# Patient Record
Sex: Male | Born: 1945 | Race: Black or African American | Hispanic: No | Marital: Married | Smoking: Never smoker
Health system: Southern US, Community
[De-identification: ages and names within clinical notes are randomized; demographics above are authoritative.]

## PROBLEM LIST (undated history)

## (undated) HISTORY — PX: OTHER SURGICAL HISTORY: SHX169

## (undated) HISTORY — PX: COLONOSCOPY: SHX174

---

## 2004-08-16 ENCOUNTER — Ambulatory Visit: Payer: Self-pay | Admitting: Family Medicine

## 2015-10-09 DIAGNOSIS — I110 Hypertensive heart disease with heart failure: Secondary | ICD-10-CM | POA: Insufficient documentation

## 2015-10-09 DIAGNOSIS — J449 Chronic obstructive pulmonary disease, unspecified: Secondary | ICD-10-CM

## 2015-10-09 DIAGNOSIS — N400 Enlarged prostate without lower urinary tract symptoms: Secondary | ICD-10-CM

## 2015-10-09 DIAGNOSIS — E785 Hyperlipidemia, unspecified: Secondary | ICD-10-CM

## 2015-10-09 HISTORY — DX: Chronic obstructive pulmonary disease, unspecified: J44.9

## 2015-10-09 HISTORY — DX: Hyperlipidemia, unspecified: E78.5

## 2015-10-09 HISTORY — DX: Benign prostatic hyperplasia without lower urinary tract symptoms: N40.0

## 2015-10-09 HISTORY — DX: Hypertensive heart disease with heart failure: I11.0

## 2016-02-19 DIAGNOSIS — G4733 Obstructive sleep apnea (adult) (pediatric): Secondary | ICD-10-CM

## 2016-02-19 HISTORY — DX: Obstructive sleep apnea (adult) (pediatric): G47.33

## 2016-03-14 DIAGNOSIS — F419 Anxiety disorder, unspecified: Secondary | ICD-10-CM

## 2016-03-14 HISTORY — DX: Anxiety disorder, unspecified: F41.9

## 2016-08-07 DIAGNOSIS — I2510 Atherosclerotic heart disease of native coronary artery without angina pectoris: Secondary | ICD-10-CM | POA: Insufficient documentation

## 2016-08-07 HISTORY — DX: Atherosclerotic heart disease of native coronary artery without angina pectoris: I25.10

## 2017-06-16 DIAGNOSIS — G8929 Other chronic pain: Secondary | ICD-10-CM

## 2017-06-16 HISTORY — DX: Other chronic pain: G89.29

## 2017-06-23 DIAGNOSIS — M1711 Unilateral primary osteoarthritis, right knee: Secondary | ICD-10-CM | POA: Insufficient documentation

## 2017-06-23 HISTORY — DX: Unilateral primary osteoarthritis, right knee: M17.11

## 2017-12-25 DIAGNOSIS — I214 Non-ST elevation (NSTEMI) myocardial infarction: Secondary | ICD-10-CM

## 2017-12-25 DIAGNOSIS — E6609 Other obesity due to excess calories: Secondary | ICD-10-CM

## 2017-12-25 HISTORY — DX: Non-ST elevation (NSTEMI) myocardial infarction: I21.4

## 2017-12-25 HISTORY — DX: Other obesity due to excess calories: E66.09

## 2018-01-19 DIAGNOSIS — Z955 Presence of coronary angioplasty implant and graft: Secondary | ICD-10-CM

## 2018-01-19 HISTORY — DX: Presence of coronary angioplasty implant and graft: Z95.5

## 2018-08-12 DIAGNOSIS — Z794 Long term (current) use of insulin: Secondary | ICD-10-CM | POA: Insufficient documentation

## 2018-08-12 HISTORY — DX: Long term (current) use of insulin: Z79.4

## 2018-11-16 DIAGNOSIS — J309 Allergic rhinitis, unspecified: Secondary | ICD-10-CM | POA: Insufficient documentation

## 2018-11-16 HISTORY — DX: Allergic rhinitis, unspecified: J30.9

## 2019-09-13 DIAGNOSIS — E1169 Type 2 diabetes mellitus with other specified complication: Secondary | ICD-10-CM

## 2019-09-13 DIAGNOSIS — G3184 Mild cognitive impairment, so stated: Secondary | ICD-10-CM

## 2019-09-13 DIAGNOSIS — Z789 Other specified health status: Secondary | ICD-10-CM

## 2019-09-13 HISTORY — DX: Mild cognitive impairment of uncertain or unknown etiology: G31.84

## 2019-09-13 HISTORY — DX: Other specified health status: Z78.9

## 2019-09-13 HISTORY — DX: Type 2 diabetes mellitus with other specified complication: E11.69

## 2019-11-11 DIAGNOSIS — R000 Tachycardia, unspecified: Secondary | ICD-10-CM | POA: Insufficient documentation

## 2019-11-11 HISTORY — DX: Tachycardia, unspecified: R00.0

## 2019-12-21 DIAGNOSIS — M47812 Spondylosis without myelopathy or radiculopathy, cervical region: Secondary | ICD-10-CM

## 2019-12-21 DIAGNOSIS — M542 Cervicalgia: Secondary | ICD-10-CM | POA: Insufficient documentation

## 2019-12-21 HISTORY — DX: Cervicalgia: M54.2

## 2019-12-21 HISTORY — DX: Spondylosis without myelopathy or radiculopathy, cervical region: M47.812

## 2019-12-30 DIAGNOSIS — M542 Cervicalgia: Secondary | ICD-10-CM

## 2019-12-30 DIAGNOSIS — M869 Osteomyelitis, unspecified: Secondary | ICD-10-CM

## 2019-12-30 DIAGNOSIS — M4642 Discitis, unspecified, cervical region: Secondary | ICD-10-CM

## 2019-12-31 DIAGNOSIS — M869 Osteomyelitis, unspecified: Secondary | ICD-10-CM | POA: Diagnosis not present

## 2019-12-31 DIAGNOSIS — M542 Cervicalgia: Secondary | ICD-10-CM | POA: Diagnosis not present

## 2019-12-31 DIAGNOSIS — M4642 Discitis, unspecified, cervical region: Secondary | ICD-10-CM | POA: Diagnosis not present

## 2020-01-01 DIAGNOSIS — M4642 Discitis, unspecified, cervical region: Secondary | ICD-10-CM | POA: Insufficient documentation

## 2020-01-01 DIAGNOSIS — M869 Osteomyelitis, unspecified: Secondary | ICD-10-CM | POA: Diagnosis not present

## 2020-01-01 DIAGNOSIS — M542 Cervicalgia: Secondary | ICD-10-CM | POA: Diagnosis not present

## 2020-01-01 DIAGNOSIS — M4622 Osteomyelitis of vertebra, cervical region: Secondary | ICD-10-CM

## 2020-01-01 HISTORY — DX: Osteomyelitis of vertebra, cervical region: M46.22

## 2020-01-01 HISTORY — DX: Discitis, unspecified, cervical region: M46.42

## 2020-01-02 DIAGNOSIS — M869 Osteomyelitis, unspecified: Secondary | ICD-10-CM | POA: Diagnosis not present

## 2020-01-02 DIAGNOSIS — M542 Cervicalgia: Secondary | ICD-10-CM | POA: Diagnosis not present

## 2020-01-02 DIAGNOSIS — M4642 Discitis, unspecified, cervical region: Secondary | ICD-10-CM | POA: Diagnosis not present

## 2020-01-03 DIAGNOSIS — M4642 Discitis, unspecified, cervical region: Secondary | ICD-10-CM | POA: Diagnosis not present

## 2020-01-03 DIAGNOSIS — M869 Osteomyelitis, unspecified: Secondary | ICD-10-CM | POA: Diagnosis not present

## 2020-01-03 DIAGNOSIS — M542 Cervicalgia: Secondary | ICD-10-CM | POA: Diagnosis not present

## 2020-01-04 DIAGNOSIS — M869 Osteomyelitis, unspecified: Secondary | ICD-10-CM | POA: Diagnosis not present

## 2020-01-04 DIAGNOSIS — M4642 Discitis, unspecified, cervical region: Secondary | ICD-10-CM | POA: Diagnosis not present

## 2020-01-04 DIAGNOSIS — E039 Hypothyroidism, unspecified: Secondary | ICD-10-CM | POA: Insufficient documentation

## 2020-01-04 DIAGNOSIS — M542 Cervicalgia: Secondary | ICD-10-CM | POA: Diagnosis not present

## 2020-01-04 DIAGNOSIS — I2510 Atherosclerotic heart disease of native coronary artery without angina pectoris: Secondary | ICD-10-CM | POA: Insufficient documentation

## 2020-01-04 DIAGNOSIS — F339 Major depressive disorder, recurrent, unspecified: Secondary | ICD-10-CM | POA: Insufficient documentation

## 2020-01-05 DIAGNOSIS — R1310 Dysphagia, unspecified: Secondary | ICD-10-CM | POA: Insufficient documentation

## 2020-01-05 DIAGNOSIS — R41841 Cognitive communication deficit: Secondary | ICD-10-CM | POA: Insufficient documentation

## 2020-01-05 DIAGNOSIS — Z7409 Other reduced mobility: Secondary | ICD-10-CM | POA: Insufficient documentation

## 2020-01-05 DIAGNOSIS — M6281 Muscle weakness (generalized): Secondary | ICD-10-CM | POA: Insufficient documentation

## 2020-01-05 DIAGNOSIS — R279 Unspecified lack of coordination: Secondary | ICD-10-CM | POA: Insufficient documentation

## 2020-01-19 ENCOUNTER — Ambulatory Visit (INDEPENDENT_AMBULATORY_CARE_PROVIDER_SITE_OTHER): Payer: Medicare Other | Admitting: Internal Medicine

## 2020-01-19 ENCOUNTER — Encounter: Payer: Self-pay | Admitting: *Deleted

## 2020-01-19 ENCOUNTER — Other Ambulatory Visit: Payer: Self-pay | Admitting: *Deleted

## 2020-01-19 ENCOUNTER — Other Ambulatory Visit: Payer: Self-pay

## 2020-01-19 ENCOUNTER — Encounter: Payer: Self-pay | Admitting: Internal Medicine

## 2020-01-19 DIAGNOSIS — E1169 Type 2 diabetes mellitus with other specified complication: Secondary | ICD-10-CM

## 2020-01-19 DIAGNOSIS — N400 Enlarged prostate without lower urinary tract symptoms: Secondary | ICD-10-CM

## 2020-01-19 DIAGNOSIS — I2510 Atherosclerotic heart disease of native coronary artery without angina pectoris: Secondary | ICD-10-CM | POA: Diagnosis not present

## 2020-01-19 DIAGNOSIS — I10 Essential (primary) hypertension: Secondary | ICD-10-CM

## 2020-01-19 DIAGNOSIS — M4642 Discitis, unspecified, cervical region: Secondary | ICD-10-CM

## 2020-01-19 DIAGNOSIS — E119 Type 2 diabetes mellitus without complications: Secondary | ICD-10-CM

## 2020-01-19 HISTORY — DX: Essential (primary) hypertension: I10

## 2020-01-19 HISTORY — DX: Type 2 diabetes mellitus without complications: E11.9

## 2020-01-19 NOTE — Progress Notes (Signed)
[facility]      Reason for Consult:discitis/ostoemyelitis of cervical region    Referring Physician: Dr. Lucianne Muss    Patient ID: Lance Shepherd, male    DOB: 1946/07/14, 74 y.o.   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  He had progressively worsening neck pain and MRI c/w above diagnosis.  He had blood culture done which did not grow anything and placed on empiric vancomycin and ceftriaxone and is currently in a SNF.  His neck pain is improved though now with more left shoulder muscular pain.  Is getting rehab at his facility.  MRI report reviewed  PMH: DM, COPD, HOH, HTN  Prior to Admission medications   Medication Sig Start Date End Date Taking? Authorizing Provider  acetaminophen (TYLENOL) 650 MG CR tablet Take by mouth.    [provider]  aspirin 81 MG EC tablet Take by mouth. 12/30/17   [provider]  BD VEO INSULIN SYRINGE U/F 31G X 15/64" 1 ML MISC USE 3 SYRINGE ONCE DAILY AS NEEDED 10/21/19   [provider]  cephALEXin (KEFLEX) 250 MG capsule Take 250 mg by mouth 3 (three) times daily. 11/11/19   [provider]  CONTOUR TEST test strip USE STRIP TO CHECK GLUCOSE THREE TIMES DAILY 10/17/19   [provider]  fluticasone (FLONASE) 50 MCG/ACT nasal spray 1 spray by Each Nare route daily as needed for rhinitis.    [provider]  fluticasone-salmeterol (ADVAIR HFA) 230-21 MCG/ACT inhaler Inhale into the lungs.    [provider]  HYDROcodone-acetaminophen (NORCO) 10-325 MG tablet Take 1 tablet by mouth every 4 (four) hours. 01/17/20   [provider]  insulin NPH-regular Human (70-30) 100 UNIT/ML injection Inject into the skin. 12/29/17   [provider]  INSULIN SYRINGE 1CC/29G (EXEL COMFORT POINT INSULIN SYR) 29G X 1/2" 1 ML MISC Use as  needed for 3 injections daily 10/14/19   [provider]  latanoprost (XALATAN) 0.005 % ophthalmic solution Place 1 drop into both eyes nightly. One drop in each eye daily. 02/13/17   [provider]  lidocaine (LIDODERM) 5 % APPLY ONE PATCH TOPICALLY TO CLEAN DRY SKIN. LEAVE ON FOR 12 HOURS THEN REMOVE. MUST WAIT AT LEAST 12 HOURS BEFORE APPLYING PATCH(ES) AGAIN. 12/26/19   [provider]  LINZESS 145 MCG CAPS capsule Take 145 mcg by mouth daily. 10/14/19   [provider]  lisinopril-hydrochlorothiazide (ZESTORETIC) 10-12.5 MG tablet Take by mouth. 08/21/16   [provider]  loratadine (CLARITIN) 10 MG tablet TAKE ONE TABLET DAILY AS NEEDED 04/10/17   [provider]  losartan (COZAAR) 25 MG tablet Take 25 mg by mouth daily. 12/20/19   [provider]  metFORMIN (GLUCOPHAGE-XR) 500 MG 24 hr tablet SMARTSIG:1 Tablet(s) By Mouth Every Evening 11/11/19   [provider]  methocarbamol (ROBAXIN) 750 MG tablet Take 750 mg by mouth 2 (two) times daily. 12/14/19   [provider]  metoprolol tartrate (LOPRESSOR) 50 MG tablet TAKE 1 2 (ONE HALF) TABLET BY MOUTH TWICE DAILY. CHECK BLOOD PRESSURE FIRST DO NOT TAKE IF BLOOD PRESSURE IS LESS THAN 100 11/11/19   [provider]  naproxen (NAPROSYN) 500 MG tablet Take 500 mg by mouth every 8 (eight) hours as needed. 12/14/19   [provider]  nitroGLYCERIN (NITROSTAT) 0.4 MG SL tablet Place under  the tongue. 04/27/18   [provider]  PERCOCET 10-325 MG tablet Take 1 tablet by mouth every 4 (four) hours. 01/18/20   [provider]  prasugrel (EFFIENT) 10 MG TABS tablet Take by mouth. 08/21/16   [provider]  tamsulosin (FLOMAX) 0.4 MG CAPS capsule Take 0.4 mg by mouth daily. 12/26/19   [provider]  tiotropium (SPIRIVA) 18 MCG inhalation capsule Place into inhaler and inhale.    [provider]  traMADol (ULTRAM) 50 MG tablet Take  50 mg by mouth every 8 (eight) hours as needed. 12/26/19   [provider]  VALIUM 2 MG tablet Take 2 mg by mouth 2 (two) times daily as needed. 12/18/19   [provider]    Allergies  Allergen Reactions  . Ticagrelor Shortness Of Breath  . Pioglitazone Diarrhea  . Statins Nausea Only and Rash    Social History   Tobacco Use  . Smoking status: Not on file  Substance Use Topics  . Alcohol use: Not on file  . Drug use: Not on file   [facility]: + cardiac disease  Review of Systems  Constitutional: negative for fevers and chills Gastrointestinal: negative for nausea and diarrhea Integument/breast: negative for rash All other systems reviewed and are negative    Constitutional: in no apparent distress There were no vitals filed for this visit. EYES: anicteric ENMT: + neck brace Cardiovascular: Cor RRR Respiratory: clear Musculoskeletal: + pedal edema noted Skin: no rash Neuro: non-focal; left shoulder with normal ROM, no swelling, no warmth  Labs: No results found for: WBC, HGB, HCT, MCV, PLT No results found for: CREATININE, BUN, NA, K, CL, CO2 No results found for: ALT, AST, GGT, ALKPHOS, BILITOT, INR   Assessment: Cervical discitis with phlegmon.  Negative cultures and plan for 6 weeks of IV ceftriaxone and vancomycin. Will get his labs and follow up in 4 weeks to see how he is doing and if 6 weeks is felt to be sufficient.   Plan: 1) continue antibiotics 2) copy of labs rtc 4 weeks

## 2020-02-28 ENCOUNTER — Encounter: Payer: Self-pay | Admitting: Internal Medicine

## 2020-02-28 ENCOUNTER — Other Ambulatory Visit: Payer: Self-pay

## 2020-02-28 ENCOUNTER — Ambulatory Visit (INDEPENDENT_AMBULATORY_CARE_PROVIDER_SITE_OTHER): Payer: Medicare Other | Admitting: Internal Medicine

## 2020-02-28 VITALS — BP 148/83 | HR 90 | Temp 97.9°F | Wt 229.0 lb

## 2020-02-28 DIAGNOSIS — Z5181 Encounter for therapeutic drug level monitoring: Secondary | ICD-10-CM

## 2020-02-28 DIAGNOSIS — E1169 Type 2 diabetes mellitus with other specified complication: Secondary | ICD-10-CM | POA: Diagnosis not present

## 2020-02-28 DIAGNOSIS — I2510 Atherosclerotic heart disease of native coronary artery without angina pectoris: Secondary | ICD-10-CM | POA: Diagnosis not present

## 2020-02-28 DIAGNOSIS — M4642 Discitis, unspecified, cervical region: Secondary | ICD-10-CM | POA: Diagnosis present

## 2020-02-29 LAB — BASIC METABOLIC PANEL
BUN: 20 mg/dL (ref 7–25)
CO2: 30 mmol/L (ref 20–32)
Calcium: 10 mg/dL (ref 8.6–10.3)
Chloride: 97 mmol/L — ABNORMAL LOW (ref 98–110)
Creat: 1.18 mg/dL (ref 0.70–1.18)
Glucose, Bld: 266 mg/dL — ABNORMAL HIGH (ref 65–99)
Potassium: 4.9 mmol/L (ref 3.5–5.3)
Sodium: 135 mmol/L (ref 135–146)

## 2020-02-29 LAB — SEDIMENTATION RATE: Sed Rate: 11 mm/h (ref 0–20)

## 2020-02-29 LAB — C-REACTIVE PROTEIN: CRP: 7.4 mg/L (ref <8.0)

## 2020-03-01 ENCOUNTER — Encounter: Payer: Self-pay | Admitting: Internal Medicine

## 2020-03-01 DIAGNOSIS — Z5181 Encounter for therapeutic drug level monitoring: Secondary | ICD-10-CM

## 2020-03-01 HISTORY — DX: Encounter for therapeutic drug level monitoring: Z51.81

## 2020-03-01 NOTE — Assessment & Plan Note (Signed)
He is doing well clinically with no significant residual symptoms at this time off of antibiotics over 1 week.  CRP and ESR at this visit are both wnl.   No concerns and he can remain off of antibiotics and knows to call with any new concerns.   rtc prn

## 2020-03-01 NOTE — Progress Notes (Signed)
   Subjective:    Patient ID: Lance Shepherd, male    DOB: 06/29/1946, 74 y.o.   MRN: [id number]  HPI Here for follow up of discitis with phlegmon of cervical region from [facility].  This was culture negative.  He was on vancomycin and ceftriaxone and completed 6 weeks in a NH/rehab.  He is now home and picc line has been removed.  He is having very minimal pain and feels much better.  Has been off of antibiotics over 1 week.  Some minimal tingling in his left foot but no numbness or tingling of the upper extremities.  No rash.    Review of Systems  Constitutional: Negative for fatigue and fever.  Musculoskeletal: Negative for arthralgias, back pain and myalgias.  Skin: Negative for rash.       Objective:   Physical Exam Constitutional:      Appearance: Normal appearance.  Eyes:     General: No scleral icterus. Pulmonary:     Effort: Pulmonary effort is normal.  Neurological:     General: No focal deficit present.     Mental Status: He is alert.  Psychiatric:        Mood and Affect: Mood normal.   SH: no tobacco        Assessment & Plan:

## 2020-03-01 NOTE — Assessment & Plan Note (Signed)
Blood sugar checked and elvated.   He will need continued efforts at good DM management.

## 2020-03-01 NOTE — Assessment & Plan Note (Signed)
Bmp checked and wnl.

## 2020-04-10 DIAGNOSIS — K5909 Other constipation: Secondary | ICD-10-CM

## 2020-04-10 DIAGNOSIS — F919 Conduct disorder, unspecified: Secondary | ICD-10-CM

## 2020-04-10 HISTORY — DX: Other constipation: K59.09

## 2020-04-10 HISTORY — DX: Conduct disorder, unspecified: F91.9

## 2020-08-16 ENCOUNTER — Other Ambulatory Visit: Payer: Self-pay

## 2020-08-16 ENCOUNTER — Telehealth (HOSPITAL_COMMUNITY): Payer: Self-pay | Admitting: *Deleted

## 2020-08-16 ENCOUNTER — Encounter: Payer: Self-pay | Admitting: Cardiology

## 2020-08-16 ENCOUNTER — Ambulatory Visit (INDEPENDENT_AMBULATORY_CARE_PROVIDER_SITE_OTHER): Payer: Medicare Other | Admitting: Cardiology

## 2020-08-16 VITALS — BP 130/70 | HR 97 | Ht 70.0 in | Wt 248.0 lb

## 2020-08-16 DIAGNOSIS — E669 Obesity, unspecified: Secondary | ICD-10-CM

## 2020-08-16 DIAGNOSIS — E1169 Type 2 diabetes mellitus with other specified complication: Secondary | ICD-10-CM | POA: Diagnosis not present

## 2020-08-16 DIAGNOSIS — I10 Essential (primary) hypertension: Secondary | ICD-10-CM

## 2020-08-16 DIAGNOSIS — R079 Chest pain, unspecified: Secondary | ICD-10-CM

## 2020-08-16 DIAGNOSIS — E782 Mixed hyperlipidemia: Secondary | ICD-10-CM

## 2020-08-16 NOTE — Progress Notes (Signed)
Cardiology Consultation:    Date:  08/16/2020   ID:  Lance Shepherd, DOB 08/21/1946, MRN [id number]  PCP:  [name], MD  Cardiologist:  Gypsy Balsam, MD   Referring MD: [name], MD   Chief Complaint  Patient presents with  . Follow-up  I would like to be establish as a patient  History of Present Illness:    Lance Shepherd is a 74 y.o. male who is being seen today for the evaluation of coronary artery disease at the request of [name], MD.  Complex past medical history he does have poorly controlled diabetes with latest hemoglobin A1c of 11.9, dyslipidemia with intolerance to statin, COPD, remote history of smoking, coronary artery disease with PTCA and stenting to the right coronary artery and circumflex artery November 2017, another PTCA and stenting of the obtuse marginal branch in 2019.  He was referred to [place] to be establish as a patient in our practice.  Overall he seems to be doing well but described to have weakness fatigue and tiredness he said he simply getting all.  Denies have any chest pain tightness squeezing pressure burning chest but he does have diabetes which is poorly controlled for long period of time.  Exertional shortness of breath is there, no swelling of lower extremities, he does have sleep apnea but does not use CPAP mask.  Does have dyslipidemia which is not controlled because of intolerance to statin.  He does not smoke anymore quit smoking years ago.  Does have family history of coronary artery disease but not premature.  Past Medical History:  Diagnosis Date  . Acute neck pain 12/21/2019  . Allergic rhinitis 11/16/2018  . Behavior disturbance 04/10/2020  . Benign prostatic hyperplasia without lower urinary tract symptoms 10/09/2015  . Cervical discitis 01/01/2020  . Chronic obstructive pulmonary disease (HCC) 10/09/2015  . Chronic pain of left knee 06/16/2017  . Constipation, chronic 04/10/2020  . Coronary arteriosclerosis in native artery  08/07/2016  . Diabetes mellitus (HCC) 01/19/2020  . 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 HTN (hypertension) 01/19/2020  . Hyperlipidemia 10/09/2015  . 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 Long-term insulin use (HCC) 08/12/2018  . MCI (mild cognitive impairment) 09/13/2019  . Medication monitoring encounter 03/01/2020  . Mild anxiety 03/14/2016  . NSTEMI, initial episode of care [facility]) 12/25/2017   Formatting of this note might be different from the original. Added automatically from request for surgery [phone]  . Obesity due to excess calories 12/25/2017  . Obstructive sleep apnea syndrome 02/19/2016  . Osteomyelitis of cervical spine (HCC) 01/01/2020   Formatting of this note might be different from the original. C5-6  . Primary osteoarthritis of right knee 06/23/2017  . Sinus tachycardia by electrocardiogram 11/11/2019  . Spondylosis of cervical region without myelopathy or radiculopathy 12/21/2019  . Statin intolerance 09/13/2019  . Status post coronary artery stent placement 01/19/2018    Past Surgical History:  Procedure Laterality Date  . cardiacstent placement    . COLONOSCOPY    . hernia repaired    . stent replacement      Current Medications: Current Meds  Medication Sig  . aspirin 81 MG EC tablet Take by mouth.  . cetirizine (ZYRTEC) 10 MG tablet Take 10 mg by mouth daily.  . fluticasone (FLONASE) 50 MCG/ACT nasal spray 1 spray by Each Nare route daily as needed for rhinitis.  Marland Kitchen insulin NPH-regular Human (70-30) 100 UNIT/ML injection  Inject into the skin.  Marland Kitchen LINZESS 145 MCG CAPS capsule Take 145 mcg by mouth daily.  Marland Kitchen losartan (COZAAR) 25 MG tablet Take 25 mg by mouth daily.  . metoprolol tartrate (LOPRESSOR) 50 MG tablet TAKE 1 2 (ONE HALF) TABLET BY MOUTH TWICE DAILY. CHECK BLOOD PRESSURE FIRST DO NOT TAKE IF BLOOD PRESSURE IS LESS THAN 100  . nitroGLYCERIN (NITROSTAT) 0.4 MG SL tablet Place under the tongue.  . tamsulosin (FLOMAX) 0.4 MG  CAPS capsule Take 0.4 mg by mouth daily.     Allergies:   Ticagrelor, Pioglitazone, and Statins   Social History   Socioeconomic History  . Marital status: Married    Spouse name: Not on file  . Number of children: Not on file  . Years of education: Not on file  . Highest education level: Not on file  Occupational History  . Not on file  Tobacco Use  . Smoking status: Never Smoker  . Smokeless tobacco: Never Used  Substance and Sexual Activity  . Alcohol use: Never  . Drug use: Never  . Sexual activity: Not Currently  Other Topics Concern  . Not on file  Social History Narrative  . Not on file   Social Determinants of Health   Financial Resource Strain: Not on file  Food Insecurity: Not on file  Transportation Needs: Not on file  Physical Activity: Not on file  Stress: Not on file  Social Connections: Not on file     Family History: The patient's family history includes COPD in his mother; Dementia in his mother; Lung cancer in his father. ROS:   Please see the history of present illness.    All 14 point review of systems negative except as described per history of present illness.  EKGs/Labs/Other Studies Reviewed:    The following studies were reviewed today:   EKG:  EKG is  ordered today.  The ekg ordered today demonstrates normal sinus rhythm normal P interval right bundle branch block  Recent Labs: 02/28/2020: BUN 20; Creat 1.18; Potassium 4.9; Sodium 135  Recent Lipid Panel No results found for: CHOL, TRIG, HDL, CHOLHDL, VLDL, LDLCALC, LDLDIRECT  Physical Exam:    VS:  BP 130/70 (BP Location: Left Arm, Patient Position: Sitting)   Pulse 97   Ht 5\' 10"  (1.778 m)   Wt 248 lb (112.5 kg)   SpO2 97%   BMI 35.58 kg/m     Wt Readings from Last 3 Encounters:  08/16/20 248 lb (112.5 kg)  02/28/20 229 lb (103.9 kg)  01/19/20 244 lb (110.7 kg)     GEN:  Well nourished, well developed in no acute distress HEENT: Normal NECK: No JVD; No carotid  bruits LYMPHATICS: No lymphadenopathy CARDIAC: RRR, no murmurs, no rubs, no gallops RESPIRATORY:  Clear to auscultation without rales, wheezing or rhonchi  ABDOMEN: Soft, non-tender, non-distended MUSCULOSKELETAL:  No edema; No deformity  SKIN: Warm and dry NEUROLOGIC:  Alert and oriented x 3 PSYCHIATRIC:  Normal affect   ASSESSMENT:    1. Primary hypertension   2. Diabetes mellitus type 2 in obese (HCC)   3. Mixed hyperlipidemia    PLAN:    In order of problems listed above:   1. Coronary artery disease: He did have stenting to RCA and circumflex artery November 2017 and then stenting to obtuse marginal branch in 2019.  He seems to have no typical symptoms, however, because of his diabetes which is poorly controlled I still think it would be reasonable to do stress  test on him make sure he does not have any inducible ischemia.  He is already on antiplatelet therapy which I will continue but he is not on statin.  He is intolerant to it.  He is already on ACE inhibitor.  Also on beta-blocker. 2. Diabetes mellitus poorly controlled.  He just switched to a new primary care physician with premature will take care of this problem better than previously.  Obviously the goal will be to improve his diabetes control with hopefully target, globin A1c around 7. 3. Mixed dyslipidemia: He is intolerant to statin with multiple muscle aches.  I will refer him to our lipid clinic for consideration of different medication I like PCSK9 agent. 4. I will schedule him to have an echocardiogram to assess left ventricle ejection fraction, also will be looking for pulmonary hypertension, he will be scheduled to have Lexiscan to rule out significant ischemia.  I will also try to modify his risk factors the best we can to prevent him to have a problem in the future.   Medication Adjustments/Labs and Tests Ordered: Current medicines are reviewed at length with the patient today.  Concerns regarding medicines are  outlined above.  No orders of the defined types were placed in this encounter.  No orders of the defined types were placed in this encounter.   Signed, Georgeanna Lea, MD, [facility]. 08/16/2020 9:21 AM    [facility]

## 2020-08-16 NOTE — Patient Instructions (Addendum)
Medication Instructions:  Your physician recommends that you continue on your current medications as directed. Please refer to the Current Medication list given to you today.  *If you need a refill on your cardiac medications before your next appointment, please call your pharmacy*   Lab Work: None. If you have labs (blood work) drawn today and your tests are completely normal, you will receive your results only by: . MyChart Message (if you have MyChart) OR . A paper copy in the mail If you have any lab test that is abnormal or we need to change your treatment, we will call you to review the results.   Testing/Procedures: Your physician has requested that you have an echocardiogram. Echocardiography is a painless test that uses sound waves to create images of your heart. It provides your doctor with information about the size and shape of your heart and how well your heart's chambers and valves are working. This procedure takes approximately one hour. There are no restrictions for this procedure.    [facility] [address] [place],  [address] Phone:  [phone]    Please arrive 15 minutes prior to your appointment time for registration and insurance purposes.  The test will take approximately 3 to 4 hours to complete; you may bring reading material.  If someone comes with you to your appointment, they will need to remain in the main lobby due to limited space in the testing area. **If you are pregnant or breastfeeding, please notify the nuclear lab prior to your appointment**  How to prepare for your Myocardial Perfusion Test: . Do not eat or drink 3 hours prior to your test, except you may have water. . Do not consume products containing caffeine (regular or decaffeinated) 12 hours prior to your test. (ex: coffee, chocolate, sodas, tea). . Do bring a list of your current medications with you.  If not listed below, you may take your medications  as normal.  . Do wear comfortable clothes (no dresses or overalls) and walking shoes, tennis shoes preferred (No heels or open toe shoes are allowed). . Do NOT wear cologne, perfume, aftershave, or lotions (deodorant is allowed). . If these instructions are not followed, your test will have to be rescheduled.  Please report to [address] for your test.  If you have questions or concerns about your appointment, you can call the [facility] at [phone].  If you cannot keep your appointment, please provide 24 hours notification to the Nuclear Lab, to avoid a possible $50 charge to your account.     Follow-Up: At [facility], you and your health needs are our priority.  As part of our continuing mission to provide you with exceptional heart care, we have created designated Provider Care Teams.  These Care Teams include your primary Cardiologist (physician) and Advanced Practice Providers (APPs -  Physician Assistants and Nurse Practitioners) who all work together to provide you with the care you need, when you need it.  We recommend signing up for the patient portal called "MyChart".  Sign up information is provided on this After Visit Summary.  MyChart is used to connect with patients for Virtual Visits (Telemedicine).  Patients are able to view lab/test results, encounter notes, upcoming appointments, etc.  Non-urgent messages can be sent to your provider as well.   To learn more about what you can do with MyChart, go to [email or web address].    Your next appointment:     6 week(s)  The format for your next appointment:   In Person  Provider:   Robert Krasowski, MD   Other Instructions   Echocardiogram An echocardiogram is a procedure that uses painless sound waves (ultrasound) to produce an image of the heart. Images from an echocardiogram can provide important information about:  Signs of coronary artery disease (CAD).  Aneurysm  detection. An aneurysm is a weak or damaged part of an artery wall that bulges out from the normal force of blood pumping through the body.  Heart size and shape. Changes in the size or shape of the heart can be associated with certain conditions, including heart failure, aneurysm, and CAD.  Heart muscle function.  Heart valve function.  Signs of a past heart attack.  Fluid buildup around the heart.  Thickening of the heart muscle.  A tumor or infectious growth around the heart valves. Tell a health care provider about:  Any allergies you have.  All medicines you are taking, including vitamins, herbs, eye drops, creams, and over-the-counter medicines.  Any blood disorders you have.  Any surgeries you have had.  Any medical conditions you have.  Whether you are pregnant or may be pregnant. What are the risks? Generally, this is a safe procedure. However, problems may occur, including:  Allergic reaction to dye (contrast) that may be used during the procedure. What happens before the procedure? No specific preparation is needed. You may eat and drink normally. What happens during the procedure?   An IV tube may be inserted into one of your veins.  You may receive contrast through this tube. A contrast is an injection that improves the quality of the pictures from your heart.  A gel will be applied to your chest.  A wand-like tool (transducer) will be moved over your chest. The gel will help to transmit the sound waves from the transducer.  The sound waves will harmlessly bounce off of your heart to allow the heart images to be captured in real-time motion. The images will be recorded on a computer. The procedure may vary among health care providers and hospitals. What happens after the procedure?  You may return to your normal, everyday life, including diet, activities, and medicines, unless your health care provider tells you not to do that. Summary  An  echocardiogram is a procedure that uses painless sound waves (ultrasound) to produce an image of the heart.  Images from an echocardiogram can provide important information about the size and shape of your heart, heart muscle function, heart valve function, and fluid buildup around your heart.  You do not need to do anything to prepare before this procedure. You may eat and drink normally.  After the echocardiogram is completed, you may return to your normal, everyday life, unless your health care provider tells you not to do that. This information is not intended to replace advice given to you by your health care provider. Make sure you discuss any questions you have with your health care provider. Document Revised: 12/16/2018 Document Reviewed: 09/27/2016 Elsevier Patient Education  2020 Elsevier Inc.   Cardiac Nuclear Scan A cardiac nuclear scan is a test that measures blood flow to the heart when a person is resting and when he or she is exercising. The test looks for problems such as:  Not enough blood reaching a portion of the heart.  The heart muscle not working normally. You may need this test if:  You have heart disease.  You have had abnormal   results.  You have had heart surgery or a balloon procedure to open up blocked arteries (angioplasty).  You have chest pain.  You have shortness of breath. In this test, a radioactive dye (tracer) is injected into your bloodstream. After the tracer has traveled to your heart, an imaging device is used to measure how much of the tracer is absorbed by or distributed to various areas of your heart. This procedure is usually done at a hospital and takes 2-4 hours. Tell a health care provider about:  Any allergies you have.  All medicines you are taking, including vitamins, herbs, eye drops, creams, and over-the-counter medicines.  Any problems you or family members have had with anesthetic medicines.  Any  blood disorders you have.  Any surgeries you have had.  Any medical conditions you have.  Whether you are pregnant or may be pregnant. What are the risks? Generally, this is a safe procedure. However, problems may occur, including:  Serious chest pain and heart attack. This is only a risk if the stress portion of the test is done.  Rapid heartbeat.  Sensation of warmth in your chest. This usually passes quickly.  Allergic reaction to the tracer. What happens before the procedure?  Ask your health care provider about changing or stopping your regular medicines. This is especially important if you are taking diabetes medicines or blood thinners.  Follow instructions from your health care provider about eating or drinking restrictions.  Remove your jewelry on the day of the procedure. What happens during the procedure?  An IV will be inserted into one of your veins.  Your health care provider will inject a small amount of radioactive tracer through the IV.  You will wait for 20-40 minutes while the tracer travels through your bloodstream.  Your heart activity will be monitored with an electrocardiogram (ECG).  You will lie down on an exam table.  Images of your heart will be taken for about 15-20 minutes.  You may also have a stress test. For this test, one of the following may be done: ? You will exercise on a treadmill or stationary bike. While you exercise, your heart's activity will be monitored with an ECG, and your blood pressure will be checked. ? You will be given medicines that will increase blood flow to parts of your heart. This is done if you are unable to exercise.  When blood flow to your heart has peaked, a tracer will again be injected through the IV.  After 20-40 minutes, you will get back on the exam table and have more images taken of your heart.  Depending on the type of tracer used, scans may need to be repeated 3-4 hours later.  Your IV line will be  removed when the procedure is over. The procedure may vary among health care providers and hospitals. What happens after the procedure?  Unless your health care provider tells you otherwise, you may return to your normal schedule, including diet, activities, and medicines.  Unless your health care provider tells you otherwise, you may increase your fluid intake. This will help to flush the contrast dye from your body. Drink enough fluid to keep your urine pale yellow.  Ask your health care provider, or the department that is doing the test: ? When will my results be ready? ? How will I get my results? Summary  A cardiac nuclear scan measures the blood flow to the heart when a person is resting and when he or she is  exercising.  Tell your health care provider if you are pregnant.  Before the procedure, ask your health care provider about changing or stopping your regular medicines. This is especially important if you are taking diabetes medicines or blood thinners.  After the procedure, unless your health care provider tells you otherwise, increase your fluid intake. This will help flush the contrast dye from your body.  After the procedure, unless your health care provider tells you otherwise, you may return to your normal schedule, including diet, activities, and medicines. This information is not intended to replace advice given to you by your health care provider. Make sure you discuss any questions you have with your health care provider. Document Revised: 02/08/2018 Document Reviewed: 02/08/2018 Elsevier Patient Education  [employer].

## 2020-08-16 NOTE — Telephone Encounter (Signed)
Patient given detailed instructions per Myocardial Perfusion Study Information Sheet for the test on 08/23/20 at 11:00. Patient notified to arrive 15 minutes early and that it is imperative to arrive on time for appointment to keep from having the test rescheduled.  If you need to cancel or reschedule your appointment, please call the office within 24 hours of your appointment. . Patient verbalized understanding.Daneil Dolin

## 2020-08-19 NOTE — Progress Notes (Signed)
Patient ID: Lance Shepherd                 DOB: 09-23-1945                    MRN: [id number]     HPI: Lance Shepherd is a 74 y.o. male patient referred to lipid clinic by Dr. Bing Matter. PMH is significant for CAD with DES to RCA and Cx in 07/2016 in addition to hx of NSTEMI with DES to obtuse marginal branch in 2019, HLD, HTN, remote hx of smoking, COPD, and poorly controlled T2DM followed by PCP.  Patient arrives today for initial visit. Of note, pt is hard of hearing. He is not currently taking any cholesterol lowering medications. He reports that he tried multiple statins in the past and states rosuvastatin sounds familiar, but he cannot remember any other statins he has tried or how many he has tried. He reports having extreme fatigue with the statins but denies myalgias. He does not adhere to cardiac healthy diet and eats fried foods often. He does not exercise regularly due to peripheral neuropathy. He reports that his DM is followed by his PCP but reports issues with medication affordability (has Medicare part D).  Current Medications: none Intolerances: rosuvastatin (extreme fatigue)  Risk Factors: ASCVD, HLD, HTN, poorly controlled T2DM, family history, former smoker LDL goal: <55 mg/dL  Diet: Eats out at restaurants often, enjoys [employer] and fried foods  Exercise: None   Family History: CAD (not premature), COPD and dementia in mother, lung cancer in father  Social History: former smoker  Labs: 09/13/19: TC 195, TG 156, HDL 35, LDL 138 03/13/20: A1c 10.4% 40/98/11: TC 183, HDL 35, TG 166, LDL 115, A1c 11.9%  Past Medical History:  Diagnosis Date  . Acute neck pain 12/21/2019  . Allergic rhinitis 11/16/2018  . Behavior disturbance 04/10/2020  . Benign prostatic hyperplasia without lower urinary tract symptoms 10/09/2015  . Cervical discitis 01/01/2020  . Chronic obstructive pulmonary disease (HCC) 10/09/2015  . Chronic pain of left knee 06/16/2017  . Constipation, chronic 04/10/2020  .  Coronary arteriosclerosis in native artery 08/07/2016  . Diabetes mellitus (HCC) 01/19/2020  . 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 HTN (hypertension) 01/19/2020  . Hyperlipidemia 10/09/2015  . 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 Long-term insulin use (HCC) 08/12/2018  . MCI (mild cognitive impairment) 09/13/2019  . Medication monitoring encounter 03/01/2020  . Mild anxiety 03/14/2016  . NSTEMI, initial episode of care [facility]) 12/25/2017   Formatting of this note might be different from the original. Added automatically from request for surgery [phone]  . Obesity due to excess calories 12/25/2017  . Obstructive sleep apnea syndrome 02/19/2016  . Osteomyelitis of cervical spine (HCC) 01/01/2020   Formatting of this note might be different from the original. C5-6  . Primary osteoarthritis of right knee 06/23/2017  . Sinus tachycardia by electrocardiogram 11/11/2019  . Spondylosis of cervical region without myelopathy or radiculopathy 12/21/2019  . Statin intolerance 09/13/2019  . Status post coronary artery stent placement 01/19/2018    Current Outpatient Medications on File Prior to Visit  Medication Sig Dispense Refill  . aspirin 81 MG EC tablet Take by mouth.    . cetirizine (ZYRTEC) 10 MG tablet Take 10 mg by mouth daily.    . fluticasone (FLONASE) 50 MCG/ACT nasal spray 1 spray by Each Nare route daily as needed for rhinitis.    . fluticasone-salmeterol (ADVAIR  HFA) 230-21 MCG/ACT inhaler Inhale into the lungs.    . insulin NPH-regular Human (70-30) 100 UNIT/ML injection Inject into the skin.    Marland Kitchen LINZESS 145 MCG CAPS capsule Take 145 mcg by mouth daily.    Marland Kitchen losartan (COZAAR) 25 MG tablet Take 25 mg by mouth daily.    . metoprolol tartrate (LOPRESSOR) 50 MG tablet TAKE 1 2 (ONE HALF) TABLET BY MOUTH TWICE DAILY. CHECK BLOOD PRESSURE FIRST DO NOT TAKE IF BLOOD PRESSURE IS LESS THAN 100    . nitroGLYCERIN (NITROSTAT) 0.4 MG SL tablet Place under the tongue.     . tamsulosin (FLOMAX) 0.4 MG CAPS capsule Take 0.4 mg by mouth daily.     No current facility-administered medications on file prior to visit.    Allergies  Allergen Reactions  . Ticagrelor Shortness Of Breath  . Pioglitazone Diarrhea  . Statins Nausea Only and Rash    Assessment/Plan:  1. Hyperlipidemia - LDL is elevated and above goal of < 55 mg/dL. Start pravastatin 20 mg daily. Insurance will require statin re-challenge of a second statin at lowest starting dose before PCSK9 inhibitor will be approved.  Will call pt in 3 weeks to assess tolerability. Consider starting Praluent 75 mg Q2 weeks if LDL remains above goal and/or patient does not tolerate medication. He is aware of $47 copay and is able to afford this. Educated patient on importance of cardiac healthy diet and maintaining a regular exercise regimen. He will avoid eating fried foods and try to take walks as he is able. Can recheck lipid panel and LFTs at next office visit with Dr. Bing Matter on 09/27/20 if tolerating medication well.   Tama Headings, PharmD, BCPS PGY2 Cardiology Pharmacy Resident

## 2020-08-20 ENCOUNTER — Other Ambulatory Visit: Payer: Self-pay

## 2020-08-20 ENCOUNTER — Ambulatory Visit (INDEPENDENT_AMBULATORY_CARE_PROVIDER_SITE_OTHER): Payer: Medicare Other | Admitting: Pharmacist

## 2020-08-20 DIAGNOSIS — E782 Mixed hyperlipidemia: Secondary | ICD-10-CM

## 2020-08-20 DIAGNOSIS — I2510 Atherosclerotic heart disease of native coronary artery without angina pectoris: Secondary | ICD-10-CM

## 2020-08-20 DIAGNOSIS — G720 Drug-induced myopathy: Secondary | ICD-10-CM

## 2020-08-20 DIAGNOSIS — T466X5A Adverse effect of antihyperlipidemic and antiarteriosclerotic drugs, initial encounter: Secondary | ICD-10-CM | POA: Diagnosis not present

## 2020-08-20 HISTORY — DX: Drug-induced myopathy: G72.0

## 2020-08-20 MED ORDER — PRAVASTATIN SODIUM 20 MG PO TABS
20.0000 mg | ORAL_TABLET | Freq: Every evening | ORAL | 3 refills | Status: DC
Start: 2020-08-20 — End: 2021-08-23

## 2020-08-20 NOTE — Patient Instructions (Addendum)
It was nice to meet you today   Your LDL is 115 and your goal is < 55  START taking pravastatin 20 mg daily   We will plan to recheck your cholesterol when you see Dr. Bing Matter on 09/27/20  Please call us at [phone] if you have any questions

## 2020-09-10 ENCOUNTER — Telehealth (HOSPITAL_COMMUNITY): Payer: Self-pay | Admitting: *Deleted

## 2020-09-10 NOTE — Telephone Encounter (Signed)
Patient given detailed instructions per Myocardial Perfusion Study Information Sheet for the test on 09/12/19. Patient notified to arrive 15 minutes early and that it is imperative to arrive on time for appointment to keep from having the test rescheduled.  If you need to cancel or reschedule your appointment, please call the office within 24 hours of your appointment. . Patient verbalized understanding. Robie Mcniel Jacqueline     

## 2020-09-11 ENCOUNTER — Ambulatory Visit (INDEPENDENT_AMBULATORY_CARE_PROVIDER_SITE_OTHER): Payer: Medicare Other

## 2020-09-11 ENCOUNTER — Telehealth: Payer: Self-pay | Admitting: Pharmacist

## 2020-09-11 ENCOUNTER — Other Ambulatory Visit: Payer: Self-pay

## 2020-09-11 DIAGNOSIS — I10 Essential (primary) hypertension: Secondary | ICD-10-CM | POA: Diagnosis not present

## 2020-09-11 DIAGNOSIS — R079 Chest pain, unspecified: Secondary | ICD-10-CM

## 2020-09-11 DIAGNOSIS — E669 Obesity, unspecified: Secondary | ICD-10-CM

## 2020-09-11 DIAGNOSIS — E1169 Type 2 diabetes mellitus with other specified complication: Secondary | ICD-10-CM

## 2020-09-11 DIAGNOSIS — E782 Mixed hyperlipidemia: Secondary | ICD-10-CM

## 2020-09-11 MED ORDER — REGADENOSON 0.4 MG/5ML IV SOLN
0.4000 mg | Freq: Once | INTRAVENOUS | Status: AC
  Administered 2020-09-11: 0.4 mg via INTRAVENOUS

## 2020-09-11 MED ORDER — TECHNETIUM TC 99M TETROFOSMIN IV KIT
31.9000 | PACK | Freq: Once | INTRAVENOUS | Status: AC | PRN
  Administered 2020-09-11: 31.9 via INTRAVENOUS

## 2020-09-11 MED ORDER — TECHNETIUM TC 99M TETROFOSMIN IV KIT
10.6000 | PACK | Freq: Once | INTRAVENOUS | Status: AC | PRN
  Administered 2020-09-11: 10.6 via INTRAVENOUS

## 2020-09-11 NOTE — Telephone Encounter (Signed)
Called pt to follow up with pravastatin tolerability. Pt reports tolerating well. Does not wish to increase his dose at this time. Pt has follow up with Dr Bing Matter in a few weeks. Will plan to have lipid panel rechecked at that time. Pending results, will see if pt is agreeable to increasing pravastatin dose at that time. Once he is taking max tolerated statin therapy, his insurance will cover PCSK9i if his LDL remains above goal.

## 2020-09-12 LAB — MYOCARDIAL PERFUSION IMAGING
LV dias vol: 72 mL (ref 62–150)
LV sys vol: 29 mL
Peak HR: 98 {beats}/min
Rest HR: 72 {beats}/min
SDS: 0
SRS: 3
SSS: 3
TID: 1.05

## 2020-09-13 ENCOUNTER — Telehealth: Payer: Self-pay

## 2020-09-13 NOTE — Telephone Encounter (Signed)
Left a message to return my call.

## 2020-09-13 NOTE — Telephone Encounter (Signed)
-----   Message from Georgeanna Lea, MD sent at 09/13/2020  4:18 PM EST ----- Stress test showed no evidence of ischemia

## 2020-09-13 NOTE — Telephone Encounter (Signed)
Patient returning a call from our office 

## 2020-09-14 ENCOUNTER — Telehealth: Payer: Self-pay

## 2020-09-14 NOTE — Telephone Encounter (Signed)
Patient notified of test results per Dr. Bing Matter. He verbalized understanding.

## 2020-09-14 NOTE — Telephone Encounter (Signed)
Patient returning a call from our office 

## 2020-09-19 ENCOUNTER — Other Ambulatory Visit: Payer: Self-pay

## 2020-09-19 ENCOUNTER — Ambulatory Visit (INDEPENDENT_AMBULATORY_CARE_PROVIDER_SITE_OTHER): Payer: Medicare Other

## 2020-09-19 DIAGNOSIS — E1169 Type 2 diabetes mellitus with other specified complication: Secondary | ICD-10-CM

## 2020-09-19 DIAGNOSIS — E669 Obesity, unspecified: Secondary | ICD-10-CM

## 2020-09-19 DIAGNOSIS — I10 Essential (primary) hypertension: Secondary | ICD-10-CM | POA: Diagnosis not present

## 2020-09-19 DIAGNOSIS — R079 Chest pain, unspecified: Secondary | ICD-10-CM | POA: Diagnosis not present

## 2020-09-19 DIAGNOSIS — E782 Mixed hyperlipidemia: Secondary | ICD-10-CM | POA: Diagnosis not present

## 2020-09-19 LAB — ECHOCARDIOGRAM COMPLETE
Area-P 1/2: 3.65 cm2
S' Lateral: 3.6 cm

## 2020-09-19 NOTE — Progress Notes (Unsigned)
Complete echocardiogram performed.  Jimmy Zianna Dercole RDCS, RVT  

## 2020-09-20 ENCOUNTER — Telehealth: Payer: Self-pay | Admitting: Cardiology

## 2020-09-20 NOTE — Telephone Encounter (Signed)
Patient returning call for echo results. 

## 2020-09-20 NOTE — Telephone Encounter (Signed)
Patient informed of results.  

## 2020-09-27 ENCOUNTER — Other Ambulatory Visit: Payer: Self-pay

## 2020-09-27 ENCOUNTER — Encounter: Payer: Self-pay | Admitting: Cardiology

## 2020-09-27 ENCOUNTER — Ambulatory Visit (INDEPENDENT_AMBULATORY_CARE_PROVIDER_SITE_OTHER): Payer: Medicare Other | Admitting: Cardiology

## 2020-09-27 VITALS — BP 136/66 | HR 121 | Ht 70.0 in | Wt 250.0 lb

## 2020-09-27 DIAGNOSIS — E782 Mixed hyperlipidemia: Secondary | ICD-10-CM

## 2020-09-27 DIAGNOSIS — I110 Hypertensive heart disease with heart failure: Secondary | ICD-10-CM | POA: Diagnosis not present

## 2020-09-27 DIAGNOSIS — G720 Drug-induced myopathy: Secondary | ICD-10-CM | POA: Diagnosis not present

## 2020-09-27 DIAGNOSIS — I2510 Atherosclerotic heart disease of native coronary artery without angina pectoris: Secondary | ICD-10-CM

## 2020-09-27 DIAGNOSIS — T466X5A Adverse effect of antihyperlipidemic and antiarteriosclerotic drugs, initial encounter: Secondary | ICD-10-CM

## 2020-09-27 DIAGNOSIS — T466X5D Adverse effect of antihyperlipidemic and antiarteriosclerotic drugs, subsequent encounter: Secondary | ICD-10-CM

## 2020-09-27 NOTE — Progress Notes (Signed)
Cardiology Office Note:    Date:  09/27/2020   ID:  Lance Shepherd, DOB 06-Jun-1946, MRN [id number]  PCP:  [name], MD  Cardiologist:  Gypsy Balsam, MD    Referring MD: [name], MD   Chief Complaint  Patient presents with   Results  I am doing fine I am here to discuss results of my tests  History of Present Illness:    Lance Shepherd is a 75 y.o. male with past medical history significant for coronary artery disease.  Apparently 2016 November he did have PTCA and stenting of the right coronary artery as well as circumflex artery.  He also got cardiac catheterization and stenting done in 2019 with stenting to obtuse marginal branch.  He does have diabetes which is Poorly controlled with hemoglobin A1c of 11.9, dyslipidemia which is not well managed because of intolerance to statin he does have remote history of smoking essential hypertension.  Comes today to my office to discuss results of his test.  He did have a stress test which showed no evidence of ischemia.  He did have echocardiogram showed preserved left ventricle ejection fraction.  In the meantime also referred him to lipid clinic and he was put on pravastatin to prove intolerance to at least 2 statins, if he felt that then we will go with PCSK9 agent.  He tells me that he tolerate this medication with no difficulties.  We will check his cholesterol today to make a decision about potentially augmenting this therapy.  Past Medical History:  Diagnosis Date   Acute neck pain 12/21/2019   Allergic rhinitis 11/16/2018   Behavior disturbance 04/10/2020   Benign prostatic hyperplasia without lower urinary tract symptoms 10/09/2015   Cervical discitis 01/01/2020   Chronic obstructive pulmonary disease (HCC) 10/09/2015   Chronic pain of left knee 06/16/2017   Constipation, chronic 04/10/2020   Coronary arteriosclerosis in native artery 08/07/2016   Coronary artery disease stent to RCA and circumflex in November 2017,  stent to obtuse marginal branch in 2019 08/07/2016   Diabetes mellitus (HCC) 01/19/2020   Diabetes mellitus type 2 in obese (HCC) 09/13/2019   HTN (hypertension) 01/19/2020   Hyperlipidemia 10/09/2015   Hypertensive heart disease with heart failure (HCC) 10/09/2015   Long-term insulin use (HCC) 08/12/2018   MCI (mild cognitive impairment) 09/13/2019   Medication monitoring encounter 03/01/2020   Mild anxiety 03/14/2016   NSTEMI, initial episode of care [facility]) 12/25/2017   Formatting of this note might be different from the original. 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 C5-6   Primary osteoarthritis of right knee 06/23/2017   Sinus tachycardia by electrocardiogram 11/11/2019   Spondylosis of cervical region without myelopathy or radiculopathy 12/21/2019   Statin intolerance 09/13/2019   Statin myopathy 08/20/2020   Status post coronary artery stent placement 01/19/2018    Past Surgical History:  Procedure Laterality Date   cardiacstent placement     COLONOSCOPY     hernia repaired     stent replacement      Current Medications: Current Meds  Medication Sig   aspirin 81 MG EC tablet Take by mouth.   cetirizine (ZYRTEC) 10 MG tablet Take 10 mg by mouth daily.   fluticasone (FLONASE) 50 MCG/ACT nasal spray 1 spray by Each Nare  route daily as needed for rhinitis.   fluticasone-salmeterol (ADVAIR HFA) 230-21 MCG/ACT inhaler Inhale into the lungs.   insulin NPH-regular Human (70-30) 100 UNIT/ML injection Inject into the skin.   LINZESS 145 MCG CAPS capsule Take 145 mcg by mouth daily.   losartan (COZAAR) 25 MG tablet Take 25 mg by mouth daily.   metoprolol tartrate (LOPRESSOR) 50 MG tablet TAKE 1 2 (ONE HALF) TABLET BY MOUTH TWICE DAILY. CHECK BLOOD PRESSURE  FIRST DO NOT TAKE IF BLOOD PRESSURE IS LESS THAN 100   nitroGLYCERIN (NITROSTAT) 0.4 MG SL tablet Place under the tongue.   pravastatin (PRAVACHOL) 20 MG tablet Take 1 tablet (20 mg total) by mouth every evening.   tamsulosin (FLOMAX) 0.4 MG CAPS capsule Take 0.4 mg by mouth daily.     Allergies:   Ticagrelor, Pioglitazone, and Statins   Social History   Socioeconomic History   Marital status: Married    Spouse name: Not on file   Number of children: Not on file   Years of education: Not on file   Highest education level: Not on file  Occupational History   Not on file  Tobacco Use   Smoking status: Never Smoker   Smokeless tobacco: Never Used  Substance and Sexual Activity   Alcohol use: Never   Drug use: Never   Sexual activity: Not Currently  Other Topics Concern   Not on file  Social History Narrative   Not on file   Social Determinants of Health   Financial Resource Strain: Not on file  Food Insecurity: Not on file  Transportation Needs: Not on file  Physical Activity: Not on file  Stress: Not on file  Social Connections: Not on file     Family History: The patient's family history includes COPD in his mother; Dementia in his mother; Lung cancer in his father. ROS:   Please see the history of present illness.    All 14 point review of systems negative except as described per history of present illness  EKGs/Labs/Other Studies Reviewed:      Recent Labs: 02/28/2020: BUN 20; Creat 1.18; Potassium 4.9; Sodium 135  Recent Lipid Panel No results found for: CHOL, TRIG, HDL, CHOLHDL, VLDL, LDLCALC, LDLDIRECT  Physical Exam:    VS:  BP 136/66 (BP Location: Right Arm, Patient Position: Sitting)    Pulse (!) 121    Ht 5\' 10"  (1.778 m)    Wt 250 lb (113.4 kg)    SpO2 94%    BMI 35.87 kg/m     Wt Readings from Last 3 Encounters:  09/27/20 250 lb (113.4 kg)  09/11/20 248 lb (112.5 kg)  08/16/20 248 lb (112.5 kg)     GEN:  Well nourished, well  developed in no acute distress HEENT: Normal NECK: No JVD; No carotid bruits LYMPHATICS: No lymphadenopathy CARDIAC: RRR, no murmurs, no rubs, no gallops RESPIRATORY:  Clear to auscultation without rales, wheezing or rhonchi  ABDOMEN: Soft, non-tender, non-distended MUSCULOSKELETAL:  No edema; No deformity  SKIN: Warm and dry LOWER EXTREMITIES: no swelling NEUROLOGIC:  Alert and oriented x 3 PSYCHIATRIC:  Normal affect   ASSESSMENT:    1. Coronary artery disease involving native heart without angina pectoris, unspecified vessel or lesion type   2. Hypertensive heart disease with heart failure (HCC)   3. Statin myopathy   4. Mixed hyperlipidemia    PLAN:    In order of problems listed above:  1. Coronary disease stable from that point review.  Stress test  reviewed with the patient showing no evidence of ischemia.  We will continue aggressive risk factors modifications. 2. Essential hypertension blood pressure well controlled continue present management. 3. Statin myopathy he is able to tolerate small dose of pravastatin which I will continue today we will check his fasting lipid profile and then decide potentially about augmenting this therapy including possibility of PCSK9 agent. 4. Mixed dyslipidemia plan as described above. 5. Diabetes again his last hemoglobin A1c was 11.9 I told him I next expressed to him that is absolutely essential critical to keep his blood pressure and diabetes as well controlled as possible that will prevent him from having complication of this serious condition.   Medication Adjustments/Labs and Tests Ordered: Current medicines are reviewed at length with the patient today.  Concerns regarding medicines are outlined above.  No orders of the defined types were placed in this encounter.  Medication changes: No orders of the defined types were placed in this encounter.   Signed, Georgeanna Lea, MD, [facility] 09/27/2020 2:07 PM    [facility]

## 2020-09-27 NOTE — Patient Instructions (Signed)
Medication Instructions:  Your physician recommends that you continue on your current medications as directed. Please refer to the Current Medication list given to you today.  *If you need a refill on your cardiac medications before your next appointment, please call your pharmacy*   Lab Work: Your physician recommends that you return for lab work in: Today for cholesterol check  If you have labs (blood work) drawn today and your tests are completely normal, you will receive your results only by: Marland Kitchen MyChart Message (if you have MyChart) OR . A paper copy in the mail If you have any lab test that is abnormal or we need to change your treatment, we will call you to review the results.   Testing/Procedures: EKG   Follow-Up: At [facility], you and your health needs are our priority.  As part of our continuing mission to provide you with exceptional heart care, we have created designated Provider Care Teams.  These Care Teams include your primary Cardiologist (physician) and Advanced Practice Providers (APPs -  Physician Assistants and Nurse Practitioners) who all work together to provide you with the care you need, when you need it.  We recommend signing up for the patient portal called "MyChart".  Sign up information is provided on this After Visit Summary.  MyChart is used to connect with patients for Virtual Visits (Telemedicine).  Patients are able to view lab/test results, encounter notes, upcoming appointments, etc.  Non-urgent messages can be sent to your provider as well.   To learn more about what you can do with MyChart, go to [email or web address].    Your next appointment:   4 month(s)  The format for your next appointment:   In Person  Provider:   Gypsy Balsam, MD   Other Instructions

## 2020-09-28 ENCOUNTER — Telehealth: Payer: Self-pay | Admitting: Pharmacist

## 2020-09-28 LAB — LIPID PANEL
Chol/HDL Ratio: 5.1 ratio — ABNORMAL HIGH (ref 0.0–5.0)
Cholesterol, Total: 167 mg/dL (ref 100–199)
HDL: 33 mg/dL — ABNORMAL LOW (ref >39)
LDL Chol Calc (NIH): 107 mg/dL — ABNORMAL HIGH (ref 0–99)
Triglycerides: 149 mg/dL (ref 0–149)
VLDL Cholesterol Cal: 27 mg/dL (ref 5–40)

## 2020-09-28 MED ORDER — REPATHA SURECLICK 140 MG/ML ~~LOC~~ SOAJ: 1.0000 "pen " | SUBCUTANEOUS | 11 refills | Status: DC

## 2020-09-28 NOTE — Telephone Encounter (Signed)
Called pt and discussed his lipid panel results. LDL is 107 on pravastatin 20mg  daily, above aggressive goal < 55 given history of recurrent and progressive ASCVD and DM.  Pt is tolerating pravastatin well but does not wish to increase his dose since he previously experienced extreme fatigue on rosuvastatin. Adding ezetimibe without increasing pravastatin dose will not bring LDL to goal.  Pt is interested in trying Repatha. Prior authorization has been submitted and approved through 03/28/21. I also enrolled pt in the [facility] [employer] grant so that he will not have a copay, this info has been called into pt's pharmacy along with Repatha rx.  Pt is aware to continue pravastatin 20mg  daily and start Repatha 140mg  Q2W injections. He will call clinic if he has any trouble with his first injection. Otherwise, will plan to have lipids rechecked at next visit with Dr [facility] in May.

## 2021-01-17 ENCOUNTER — Other Ambulatory Visit: Payer: Self-pay

## 2021-01-24 ENCOUNTER — Encounter: Payer: Self-pay | Admitting: Cardiology

## 2021-01-24 ENCOUNTER — Other Ambulatory Visit: Payer: Self-pay

## 2021-01-24 ENCOUNTER — Ambulatory Visit (INDEPENDENT_AMBULATORY_CARE_PROVIDER_SITE_OTHER): Payer: Medicare Other | Admitting: Cardiology

## 2021-01-24 VITALS — BP 120/84 | HR 84 | Ht 70.0 in | Wt 241.0 lb

## 2021-01-24 DIAGNOSIS — I110 Hypertensive heart disease with heart failure: Secondary | ICD-10-CM

## 2021-01-24 DIAGNOSIS — E782 Mixed hyperlipidemia: Secondary | ICD-10-CM | POA: Diagnosis not present

## 2021-01-24 DIAGNOSIS — I2510 Atherosclerotic heart disease of native coronary artery without angina pectoris: Secondary | ICD-10-CM | POA: Diagnosis not present

## 2021-01-24 DIAGNOSIS — Z789 Other specified health status: Secondary | ICD-10-CM

## 2021-01-24 NOTE — Patient Instructions (Signed)
Medication Instructions:  No medication changes. *If you need a refill on your cardiac medications before your next appointment, please call your pharmacy*   Lab Work: Your physician recommends that you have labs done in the office today. Your test included  CMP and lipids.  If you have labs (blood work) drawn today and your tests are completely normal, you will receive your results only by: Marland Kitchen MyChart Message (if you have MyChart) OR . A paper copy in the mail If you have any lab test that is abnormal or we need to change your treatment, we will call you to review the results.   Testing/Procedures: None ordered   Follow-Up: At [facility], you and your health needs are our priority.  As part of our continuing mission to provide you with exceptional heart care, we have created designated Provider Care Teams.  These Care Teams include your primary Cardiologist (physician) and Advanced Practice Providers (APPs -  Physician Assistants and Nurse Practitioners) who all work together to provide you with the care you need, when you need it.  We recommend signing up for the patient portal called "MyChart".  Sign up information is provided on this After Visit Summary.  MyChart is used to connect with patients for Virtual Visits (Telemedicine).  Patients are able to view lab/test results, encounter notes, upcoming appointments, etc.  Non-urgent messages can be sent to your provider as well.   To learn more about what you can do with MyChart, go to [email or web address].    Your next appointment:   5 month(s)  The format for your next appointment:   In Person  Provider:   Gypsy Balsam, MD   Other Instructions NA

## 2021-01-24 NOTE — Progress Notes (Signed)
Cardiology Office Note:    Date:  01/24/2021   ID:  Lance Shepherd, DOB 04-16-1946, MRN [id number]  PCP:  [name], MD  Cardiologist:  Gypsy Balsam, MD    Referring MD: [name], MD   Chief Complaint  Patient presents with  . Follow-up  I am doing fine but have leg cramps  History of Present Illness:    Lance Shepherd is a 75 y.o. male with complex past medical history.  In November 2016 he have PTCA and stenting of the right coronary artery as well as circumflex artery.  Then in 2019 he required another cardiac catheterization with stenting to obtuse marginal branch his past medical history is also significant for diabetes which is poorly controlled dyslipidemia with intolerance to multiple statin, essential hypertension. He is showing up in my office today feeling well complaint he got some cramps.  That happen when he try to do something but usually happens at night when he does nothing.  That include cramps in his legs.  Denies have any cardiac complaint meaning no chest pain tightness squeezing pressure burning chest no palpitations  Past Medical History:  Diagnosis Date  . Acute neck pain 12/21/2019  . Allergic rhinitis 11/16/2018  . Behavior disturbance 04/10/2020  . Benign prostatic hyperplasia without lower urinary tract symptoms 10/09/2015  . Cervical discitis 01/01/2020  . Chronic obstructive pulmonary disease (HCC) 10/09/2015  . Chronic pain of left knee 06/16/2017  . Constipation, chronic 04/10/2020  . Coronary arteriosclerosis in native artery 08/07/2016  . Coronary artery disease stent to RCA and circumflex in November 2017, stent to obtuse marginal branch in 2019 08/07/2016  . Diabetes mellitus (HCC) 01/19/2020  . 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 HTN (hypertension) 01/19/2020  . Hyperlipidemia 10/09/2015  . 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 Long-term insulin use (HCC) 08/12/2018  . MCI (mild cognitive impairment)  09/13/2019  . Medication monitoring encounter 03/01/2020  . Mild anxiety 03/14/2016  . NSTEMI, initial episode of care [facility]) 12/25/2017   Formatting of this note might be different from the original. Added automatically from request for surgery [id number]  . Obesity due to excess calories 12/25/2017  . Obstructive sleep apnea syndrome 02/19/2016  . Osteomyelitis of cervical spine (HCC) 01/01/2020   Formatting of this note might be different from the original. C5-6  . Primary osteoarthritis of right knee 06/23/2017  . Sinus tachycardia by electrocardiogram 11/11/2019  . Spondylosis of cervical region without myelopathy or radiculopathy 12/21/2019  . Statin intolerance 09/13/2019  . Statin myopathy 08/20/2020  . Status post coronary artery stent placement 01/19/2018    Past Surgical History:  Procedure Laterality Date  . cardiacstent placement    . COLONOSCOPY    . hernia repaired    . stent replacement      Current Medications: Current Meds  Medication Sig  . aspirin 325 MG tablet Take 325 mg by mouth daily.  . cetirizine (ZYRTEC) 10 MG tablet Take 10 mg by mouth daily.  . Evolocumab (REPATHA SURECLICK) 140 MG/ML SOAJ Inject 1 pen into the skin every 14 (fourteen) days.  . fluticasone (FLONASE) 50 MCG/ACT nasal spray 1 spray by Each Nare route daily as needed for rhinitis.  . Fluticasone-Salmeterol (ADVAIR) 250-50 MCG/DOSE AEPB Inhale 1 puff into the lungs as needed for shortness of breath.  . insulin NPH-regular Human (70-30) 100 UNIT/ML injection Inject into the skin.  Marland Kitchen LINZESS 145 MCG CAPS capsule Take 145  mcg by mouth daily.  Marland Kitchen losartan (COZAAR) 25 MG tablet Take 25 mg by mouth daily.  . metFORMIN (GLUCOPHAGE-XR) 750 MG 24 hr tablet Take 750 mg by mouth 2 (two) times daily.  . metoprolol tartrate (LOPRESSOR) 50 MG tablet TAKE 1 2 (ONE HALF) TABLET BY MOUTH TWICE DAILY. CHECK BLOOD PRESSURE FIRST DO NOT TAKE IF BLOOD PRESSURE IS LESS THAN 100  . nitroGLYCERIN (NITROSTAT) 0.4 MG SL tablet Place  under the tongue.  . prasugrel (EFFIENT) 10 MG TABS tablet Take 1 tablet by mouth daily.  . tamsulosin (FLOMAX) 0.4 MG CAPS capsule Take 0.4 mg by mouth daily.  . TRADJENTA 5 MG TABS tablet Take 5 mg by mouth daily.     Allergies:   Ticagrelor, Pioglitazone, and Statins   Social History   Socioeconomic History  . Marital status: Married    Spouse name: Not on file  . Number of children: Not on file  . Years of education: Not on file  . Highest education level: Not on file  Occupational History  . Not on file  Tobacco Use  . Smoking status: Never Smoker  . Smokeless tobacco: Never Used  Substance and Sexual Activity  . Alcohol use: Never  . Drug use: Never  . Sexual activity: Not Currently  Other Topics Concern  . Not on file  Social History Narrative  . Not on file   Social Determinants of Health   Financial Resource Strain: Not on file  Food Insecurity: Not on file  Transportation Needs: Not on file  Physical Activity: Not on file  Stress: Not on file  Social Connections: Not on file     Family History: The patient's family history includes COPD in his mother; Dementia in his mother; Lung cancer in his father. ROS:   Please see the history of present illness.    All 14 point review of systems negative except as described per history of present illness  EKGs/Labs/Other Studies Reviewed:      Recent Labs: 02/28/2020: BUN 20; Creat 1.18; Potassium 4.9; Sodium 135  Recent Lipid Panel    Component Value Date/Time   CHOL 167 09/27/2020 1416   TRIG 149 09/27/2020 1416   HDL 33 (L) 09/27/2020 1416   CHOLHDL 5.1 (H) 09/27/2020 1416   LDLCALC 107 (H) 09/27/2020 1416    Physical Exam:    VS:  BP 120/84 (BP Location: Right Arm, Patient Position: Sitting, Cuff Size: Normal)   Pulse 84   Ht 5\' 10"  (1.778 m)   Wt 241 lb (109.3 kg)   SpO2 95%   BMI 34.58 kg/m     Wt Readings from Last 3 Encounters:  01/24/21 241 lb (109.3 kg)  09/27/20 250 lb (113.4 kg)   09/11/20 248 lb (112.5 kg)     GEN:  Well nourished, well developed in no acute distress HEENT: Normal NECK: No JVD; No carotid bruits LYMPHATICS: No lymphadenopathy CARDIAC: RRR, no murmurs, no rubs, no gallops RESPIRATORY:  Clear to auscultation without rales, wheezing or rhonchi  ABDOMEN: Soft, non-tender, non-distended MUSCULOSKELETAL:  No edema; No deformity  SKIN: Warm and dry LOWER EXTREMITIES: no swelling NEUROLOGIC:  Alert and oriented x 3 PSYCHIATRIC:  Normal affect   ASSESSMENT:    1. Coronary artery disease involving native heart without angina pectoris, unspecified vessel or lesion type   2. Hypertensive heart disease with heart failure (HCC)   3. Mixed hyperlipidemia   4. Statin intolerance    PLAN:    In order of  problems listed above:  1. Coronary disease stable from that point review we will continue present management the key is risk factors modifications which still somewhat problematic. 2. Diabetes mellitus which is still poorly controlled I did review his K PN which show me his hemoglobin A1c of 12.3 last time in November 06, 2020.  He is working hard with his primary care physician today trying to correct his and I told him that this is absolutely essential to get this problem under control. 3. Dyslipidemia, finally he is on PCSK9 agent, will schedule him to have fasting lipid profile done today. 4. Statin intolerance.  Noted. 5. Overall things are looking better however still very unhappy about his poorly controlled diabetes he is working hard with his primary care physician to get this under control   Medication Adjustments/Labs and Tests Ordered: Current medicines are reviewed at length with the patient today.  Concerns regarding medicines are outlined above.  No orders of the defined types were placed in this encounter.  Medication changes: No orders of the defined types were placed in this encounter.   Signed, Georgeanna Lea, MD, [facility] 01/24/2021  1:27 PM    [facility]

## 2021-01-24 NOTE — Addendum Note (Signed)
Addended by: Eleonore Chiquito on: 01/24/2021 01:35 PM   Modules accepted: Orders

## 2021-01-25 LAB — COMPREHENSIVE METABOLIC PANEL
ALT: 47 IU/L — ABNORMAL HIGH (ref 0–44)
AST: 29 IU/L (ref 0–40)
Albumin/Globulin Ratio: 1.5 (ref 1.2–2.2)
Albumin: 4.6 g/dL (ref 3.7–4.7)
Alkaline Phosphatase: 90 IU/L (ref 44–121)
BUN/Creatinine Ratio: 17 (ref 10–24)
BUN: 21 mg/dL (ref 8–27)
Bilirubin Total: 1 mg/dL (ref 0.0–1.2)
CO2: 23 mmol/L (ref 20–29)
Calcium: 10.4 mg/dL — ABNORMAL HIGH (ref 8.6–10.2)
Chloride: 95 mmol/L — ABNORMAL LOW (ref 96–106)
Creatinine, Ser: 1.25 mg/dL (ref 0.76–1.27)
Globulin, Total: 3 g/dL (ref 1.5–4.5)
Glucose: 367 mg/dL — ABNORMAL HIGH (ref 65–99)
Potassium: 5.4 mmol/L — ABNORMAL HIGH (ref 3.5–5.2)
Sodium: 134 mmol/L (ref 134–144)
Total Protein: 7.6 g/dL (ref 6.0–8.5)
eGFR: 60 mL/min/{1.73_m2} (ref >59)

## 2021-01-25 LAB — LIPID PANEL
Chol/HDL Ratio: 4.5 ratio (ref 0.0–5.0)
Cholesterol, Total: 172 mg/dL (ref 100–199)
HDL: 38 mg/dL — ABNORMAL LOW (ref >39)
LDL Chol Calc (NIH): 108 mg/dL — ABNORMAL HIGH (ref 0–99)
Triglycerides: 149 mg/dL (ref 0–149)
VLDL Cholesterol Cal: 26 mg/dL (ref 5–40)

## 2021-01-29 ENCOUNTER — Telehealth: Payer: Self-pay | Admitting: Emergency Medicine

## 2021-01-29 DIAGNOSIS — Z79899 Other long term (current) drug therapy: Secondary | ICD-10-CM

## 2021-01-29 NOTE — Telephone Encounter (Signed)
Called patient informed him of results, he will repeat labs today.

## 2021-01-29 NOTE — Telephone Encounter (Signed)
-----   Message from Georgeanna Lea, MD sent at 01/29/2021 10:50 AM EDT ----- Potassium is elevated.  Lets recheck Chem-7 today and tomorrow

## 2021-01-30 LAB — BASIC METABOLIC PANEL
BUN/Creatinine Ratio: 13 (ref 10–24)
BUN: 15 mg/dL (ref 8–27)
CO2: 23 mmol/L (ref 20–29)
Calcium: 9.8 mg/dL (ref 8.6–10.2)
Chloride: 94 mmol/L — ABNORMAL LOW (ref 96–106)
Creatinine, Ser: 1.15 mg/dL (ref 0.76–1.27)
Glucose: 400 mg/dL — ABNORMAL HIGH (ref 65–99)
Potassium: 4.8 mmol/L (ref 3.5–5.2)
Sodium: 133 mmol/L — ABNORMAL LOW (ref 134–144)
eGFR: 66 mL/min/{1.73_m2} (ref >59)

## 2021-08-23 ENCOUNTER — Encounter: Payer: Self-pay | Admitting: Cardiology

## 2021-08-23 ENCOUNTER — Other Ambulatory Visit: Payer: Self-pay

## 2021-08-23 ENCOUNTER — Ambulatory Visit (INDEPENDENT_AMBULATORY_CARE_PROVIDER_SITE_OTHER): Payer: Medicare Other | Admitting: Cardiology

## 2021-08-23 VITALS — BP 148/86 | HR 88 | Ht 70.0 in | Wt 254.4 lb

## 2021-08-23 DIAGNOSIS — I2510 Atherosclerotic heart disease of native coronary artery without angina pectoris: Secondary | ICD-10-CM | POA: Diagnosis not present

## 2021-08-23 DIAGNOSIS — G720 Drug-induced myopathy: Secondary | ICD-10-CM | POA: Diagnosis not present

## 2021-08-23 DIAGNOSIS — T466X5D Adverse effect of antihyperlipidemic and antiarteriosclerotic drugs, subsequent encounter: Secondary | ICD-10-CM | POA: Diagnosis not present

## 2021-08-23 DIAGNOSIS — I10 Essential (primary) hypertension: Secondary | ICD-10-CM | POA: Diagnosis not present

## 2021-08-23 DIAGNOSIS — T466X5A Adverse effect of antihyperlipidemic and antiarteriosclerotic drugs, initial encounter: Secondary | ICD-10-CM

## 2021-08-23 NOTE — Addendum Note (Signed)
Addended by: Hazle Quant on: 08/23/2021 03:12 PM   Modules accepted: Orders

## 2021-08-23 NOTE — Patient Instructions (Signed)
Medication Instructions:  Your physician recommends that you continue on your current medications as directed. Please refer to the Current Medication list given to you today.  *If you need a refill on your cardiac medications before your next appointment, please call your pharmacy*   Lab Work: None If you have labs (blood work) drawn today and your tests are completely normal, you will receive your results only by: MyChart Message (if you have MyChart) OR A paper copy in the mail If you have any lab test that is abnormal or we need to change your treatment, we will call you to review the results.   Testing/Procedures: Your physician has requested that you have an echocardiogram. Echocardiography is a painless test that uses sound waves to create images of your heart. It provides your doctor with information about the size and shape of your heart and how well your heart's chambers and valves are working. This procedure takes approximately one hour. There are no restrictions for this procedure.    Follow-Up: At [facility], you and your health needs are our priority.  As part of our continuing mission to provide you with exceptional heart care, we have created designated Provider Care Teams.  These Care Teams include your primary Cardiologist (physician) and Advanced Practice Providers (APPs -  Physician Assistants and Nurse Practitioners) who all work together to provide you with the care you need, when you need it.  We recommend signing up for the patient portal called "MyChart".  Sign up information is provided on this After Visit Summary.  MyChart is used to connect with patients for Virtual Visits (Telemedicine).  Patients are able to view lab/test results, encounter notes, upcoming appointments, etc.  Non-urgent messages can be sent to your provider as well.   To learn more about what you can do with MyChart, go to [email or web address].    Your next appointment:   6  month(s)  The format for your next appointment:   In Person  Provider:   Robert Krasowski, MD   Other Instructions   

## 2021-08-23 NOTE — Progress Notes (Signed)
Cardiology Office Note:    Date:  08/23/2021   ID:  Lance Shepherd, DOB 1945-09-20, MRN [id number]  PCP:  [name], MD  Cardiologist:  Gypsy Balsam, MD    Referring MD: [name], MD   Chief Complaint  Patient presents with   Follow-up  I have swelling of my legs  History of Present Illness:    Lance Shepherd is a 75 y.o. male  with complex past medical history.  In November 2016 he have PTCA and stenting of the right coronary artery as well as circumflex artery.  Then in 2019 he required another cardiac catheterization with stenting to obtuse marginal branch his past medical history is also significant for diabetes which is poorly controlled dyslipidemia with intolerance to multiple statin, essential hypertension. He is coming today to my office for follow-up.  Overall he is doing well but the problem is that he does have some swelling of lower extremities.  He was given diuretic with relief he takes diuretic on as-needed basis but he is simply concerned about it.  Denies have any cardiac complaints otherwise.  There is no chest pain tightness squeezing pressure burning chest.  He otherwise seems to be doing well  Past Medical History:  Diagnosis Date   Acute neck pain 12/21/2019   Allergic rhinitis 11/16/2018   Behavior disturbance 04/10/2020   Benign prostatic hyperplasia without lower urinary tract symptoms 10/09/2015   Cervical discitis 01/01/2020   Chronic obstructive pulmonary disease (HCC) 10/09/2015   Chronic pain of left knee 06/16/2017   Constipation, chronic 04/10/2020   Coronary arteriosclerosis in native artery 08/07/2016   Coronary artery disease stent to RCA and circumflex in November 2017, stent to obtuse marginal branch in 2019 08/07/2016   Diabetes mellitus (HCC) 01/19/2020   Diabetes mellitus type 2 in obese (HCC) 09/13/2019   HTN (hypertension) 01/19/2020   Hyperlipidemia 10/09/2015   Hypertensive heart disease with heart failure (HCC) 10/09/2015   Long-term  insulin use (HCC) 08/12/2018   MCI (mild cognitive impairment) 09/13/2019   Medication monitoring encounter 03/01/2020   Mild anxiety 03/14/2016   NSTEMI, initial episode of care [facility]) 12/25/2017   Formatting of this note might be different from the original. 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 C5-6   Primary osteoarthritis of right knee 06/23/2017   Sinus tachycardia by electrocardiogram 11/11/2019   Spondylosis of cervical region without myelopathy or radiculopathy 12/21/2019   Statin intolerance 09/13/2019   Statin myopathy 08/20/2020   Status post coronary artery stent placement 01/19/2018    Past Surgical History:  Procedure Laterality Date   cardiacstent placement     COLONOSCOPY     hernia repaired     stent replacement      Current Medications: Current Meds  Medication Sig   aspirin 325 MG tablet Take 325 mg by mouth daily.   fluticasone (FLONASE) 50 MCG/ACT nasal spray Place 1 spray into both nostrils daily.   Fluticasone-Salmeterol (ADVAIR) 250-50 MCG/DOSE AEPB Inhale 1 puff into the lungs as needed for shortness of breath.   furosemide (LASIX) 20 MG tablet Take 20 mg by mouth as needed for fluid or edema.   gabapentin (NEURONTIN) 300 MG capsule Take 300 mg by mouth 3 (three) times daily.   insulin NPH-regular Human (70-30) 100  UNIT/ML injection Inject 100 Units into the skin 2 (two) times daily with a meal.   losartan (COZAAR) 25 MG tablet Take 25 mg by mouth daily.   magnesium hydroxide (MILK OF MAGNESIA) 400 MG/5ML suspension Take 5 mLs by mouth daily as needed for mild constipation.   tamsulosin (FLOMAX) 0.4 MG CAPS capsule Take 0.4 mg by mouth daily.     Allergies:   Ticagrelor, Pioglitazone, and Statins   Social History   Socioeconomic History   Marital  status: Married    Spouse name: Not on file   Number of children: Not on file   Years of education: Not on file   Highest education level: Not on file  Occupational History   Not on file  Tobacco Use   Smoking status: Never   Smokeless tobacco: Never  Substance and Sexual Activity   Alcohol use: Never   Drug use: Never   Sexual activity: Not Currently  Other Topics Concern   Not on file  Social History Narrative   Not on file   Social Determinants of Health   Financial Resource Strain: Not on file  Food Insecurity: Not on file  Transportation Needs: Not on file  Physical Activity: Not on file  Stress: Not on file  Social Connections: Not on file     Family History: The patient's family history includes COPD in his mother; Dementia in his mother; Lung cancer in his father. ROS:   Please see the history of present illness.    All 14 point review of systems negative except as described per history of present illness  EKGs/Labs/Other Studies Reviewed:      Recent Labs: 01/24/2021: ALT 47 01/29/2021: BUN 15; Creatinine, Ser 1.15; Potassium 4.8; Sodium 133  Recent Lipid Panel    Component Value Date/Time   CHOL 172 01/24/2021 1339   TRIG 149 01/24/2021 1339   HDL 38 (L) 01/24/2021 1339   CHOLHDL 4.5 01/24/2021 1339   LDLCALC 108 (H) 01/24/2021 1339    Physical Exam:    VS:  BP (!) 148/86 (BP Location: Right Arm, Patient Position: Sitting)    Pulse 88    Ht 5\' 10"  (1.778 m)    Wt 254 lb 6.4 oz (115.4 kg)    SpO2 95%    BMI 36.50 kg/m     Wt Readings from Last 3 Encounters:  08/23/21 254 lb 6.4 oz (115.4 kg)  01/24/21 241 lb (109.3 kg)  09/27/20 250 lb (113.4 kg)     GEN:  Well nourished, well developed in no acute distress HEENT: Normal NECK: No JVD; No carotid bruits LYMPHATICS: No lymphadenopathy CARDIAC: RRR, no murmurs, no rubs, no gallops RESPIRATORY:  Clear to auscultation without rales, wheezing or rhonchi  ABDOMEN: Soft, non-tender,  non-distended MUSCULOSKELETAL:  No edema; No deformity  SKIN: Warm and dry LOWER EXTREMITIES: 1+ swelling NEUROLOGIC:  Alert and oriented x 3 PSYCHIATRIC:  Normal affect   ASSESSMENT:    1. Coronary artery disease involving native heart without angina pectoris, unspecified vessel or lesion type   2. Primary hypertension   3. Statin myopathy    PLAN:    In order of problems listed above:  Coronary disease seems to be stable from that point review and appropriate medication which include antiplatelets therapy which I will continue. Essential hypertension: Blood pressure still somewhat elevated.  But he tells me at home it is always good.  We will continue monitoring. Swelling of lower extremities echocardiogram will be performed to assess left  ventricle ejection fraction Dyslipidemia he was referred to lipid clinic.   Medication Adjustments/Labs and Tests Ordered: Current medicines are reviewed at length with the patient today.  Concerns regarding medicines are outlined above.  No orders of the defined types were placed in this encounter.  Medication changes: No orders of the defined types were placed in this encounter.   Signed, Georgeanna Lea, MD, [facility] 08/23/2021 2:46 PM     [facility]

## 2021-08-29 DIAGNOSIS — R051 Acute cough: Secondary | ICD-10-CM | POA: Diagnosis not present

## 2021-08-29 DIAGNOSIS — R0981 Nasal congestion: Secondary | ICD-10-CM | POA: Diagnosis not present

## 2021-08-30 ENCOUNTER — Ambulatory Visit: Payer: PRIVATE HEALTH INSURANCE

## 2021-09-01 ENCOUNTER — Emergency Department (HOSPITAL_COMMUNITY): Payer: Medicare Other

## 2021-09-01 ENCOUNTER — Encounter (HOSPITAL_COMMUNITY): Payer: Self-pay

## 2021-09-01 ENCOUNTER — Emergency Department (HOSPITAL_COMMUNITY): Admit: 2021-09-01 | Payer: Medicare Other | Admitting: Cardiovascular Disease

## 2021-09-01 ENCOUNTER — Inpatient Hospital Stay (HOSPITAL_COMMUNITY)
Admission: EM | Admit: 2021-09-01 | Discharge: 2021-09-05 | DRG: 246 | Disposition: A | Discharge status: Home / Self Care | Payer: Medicare Other | Attending: Internal Medicine | Admitting: Internal Medicine

## 2021-09-01 ENCOUNTER — Other Ambulatory Visit: Payer: Self-pay

## 2021-09-01 DIAGNOSIS — R6889 Other general symptoms and signs: Secondary | ICD-10-CM | POA: Diagnosis not present

## 2021-09-01 DIAGNOSIS — N390 Urinary tract infection, site not specified: Secondary | ICD-10-CM | POA: Diagnosis not present

## 2021-09-01 DIAGNOSIS — Z6836 Body mass index (BMI) 36.0-36.9, adult: Secondary | ICD-10-CM

## 2021-09-01 DIAGNOSIS — Z87891 Personal history of nicotine dependence: Secondary | ICD-10-CM

## 2021-09-01 DIAGNOSIS — E119 Type 2 diabetes mellitus without complications: Secondary | ICD-10-CM

## 2021-09-01 DIAGNOSIS — I4510 Unspecified right bundle-branch block: Secondary | ICD-10-CM | POA: Diagnosis present

## 2021-09-01 DIAGNOSIS — R739 Hyperglycemia, unspecified: Secondary | ICD-10-CM | POA: Diagnosis not present

## 2021-09-01 DIAGNOSIS — E6609 Other obesity due to excess calories: Secondary | ICD-10-CM | POA: Diagnosis present

## 2021-09-01 DIAGNOSIS — Z825 Family history of asthma and other chronic lower respiratory diseases: Secondary | ICD-10-CM

## 2021-09-01 DIAGNOSIS — I252 Old myocardial infarction: Secondary | ICD-10-CM

## 2021-09-01 DIAGNOSIS — K219 Gastro-esophageal reflux disease without esophagitis: Secondary | ICD-10-CM | POA: Diagnosis present

## 2021-09-01 DIAGNOSIS — M1711 Unilateral primary osteoarthritis, right knee: Secondary | ICD-10-CM | POA: Diagnosis present

## 2021-09-01 DIAGNOSIS — E1169 Type 2 diabetes mellitus with other specified complication: Secondary | ICD-10-CM | POA: Diagnosis not present

## 2021-09-01 DIAGNOSIS — I5021 Acute systolic (congestive) heart failure: Secondary | ICD-10-CM | POA: Diagnosis not present

## 2021-09-01 DIAGNOSIS — Z955 Presence of coronary angioplasty implant and graft: Secondary | ICD-10-CM

## 2021-09-01 DIAGNOSIS — I214 Non-ST elevation (NSTEMI) myocardial infarction: Principal | ICD-10-CM | POA: Diagnosis present

## 2021-09-01 DIAGNOSIS — Z743 Need for continuous supervision: Secondary | ICD-10-CM | POA: Diagnosis not present

## 2021-09-01 DIAGNOSIS — J309 Allergic rhinitis, unspecified: Secondary | ICD-10-CM | POA: Diagnosis present

## 2021-09-01 DIAGNOSIS — U071 COVID-19: Secondary | ICD-10-CM | POA: Diagnosis not present

## 2021-09-01 DIAGNOSIS — I110 Hypertensive heart disease with heart failure: Secondary | ICD-10-CM | POA: Diagnosis not present

## 2021-09-01 DIAGNOSIS — E1165 Type 2 diabetes mellitus with hyperglycemia: Secondary | ICD-10-CM | POA: Diagnosis not present

## 2021-09-01 DIAGNOSIS — N400 Enlarged prostate without lower urinary tract symptoms: Secondary | ICD-10-CM | POA: Diagnosis present

## 2021-09-01 DIAGNOSIS — G3184 Mild cognitive impairment, so stated: Secondary | ICD-10-CM | POA: Diagnosis present

## 2021-09-01 DIAGNOSIS — Z794 Long term (current) use of insulin: Secondary | ICD-10-CM

## 2021-09-01 DIAGNOSIS — E785 Hyperlipidemia, unspecified: Secondary | ICD-10-CM | POA: Diagnosis not present

## 2021-09-01 DIAGNOSIS — G4733 Obstructive sleep apnea (adult) (pediatric): Secondary | ICD-10-CM | POA: Diagnosis present

## 2021-09-01 DIAGNOSIS — I517 Cardiomegaly: Secondary | ICD-10-CM | POA: Diagnosis not present

## 2021-09-01 DIAGNOSIS — I25119 Atherosclerotic heart disease of native coronary artery with unspecified angina pectoris: Secondary | ICD-10-CM

## 2021-09-01 DIAGNOSIS — E669 Obesity, unspecified: Secondary | ICD-10-CM | POA: Diagnosis not present

## 2021-09-01 DIAGNOSIS — R079 Chest pain, unspecified: Secondary | ICD-10-CM | POA: Diagnosis not present

## 2021-09-01 DIAGNOSIS — J811 Chronic pulmonary edema: Secondary | ICD-10-CM | POA: Diagnosis not present

## 2021-09-01 DIAGNOSIS — Z79899 Other long term (current) drug therapy: Secondary | ICD-10-CM | POA: Diagnosis not present

## 2021-09-01 DIAGNOSIS — I25110 Atherosclerotic heart disease of native coronary artery with unstable angina pectoris: Secondary | ICD-10-CM | POA: Diagnosis not present

## 2021-09-01 DIAGNOSIS — I2510 Atherosclerotic heart disease of native coronary artery without angina pectoris: Secondary | ICD-10-CM | POA: Diagnosis present

## 2021-09-01 DIAGNOSIS — Z7982 Long term (current) use of aspirin: Secondary | ICD-10-CM

## 2021-09-01 DIAGNOSIS — J441 Chronic obstructive pulmonary disease with (acute) exacerbation: Secondary | ICD-10-CM | POA: Diagnosis not present

## 2021-09-01 DIAGNOSIS — R0789 Other chest pain: Secondary | ICD-10-CM

## 2021-09-01 DIAGNOSIS — Z789 Other specified health status: Secondary | ICD-10-CM | POA: Diagnosis present

## 2021-09-01 DIAGNOSIS — Z888 Allergy status to other drugs, medicaments and biological substances status: Secondary | ICD-10-CM

## 2021-09-01 DIAGNOSIS — I499 Cardiac arrhythmia, unspecified: Secondary | ICD-10-CM | POA: Diagnosis not present

## 2021-09-01 DIAGNOSIS — R112 Nausea with vomiting, unspecified: Secondary | ICD-10-CM | POA: Diagnosis not present

## 2021-09-01 DIAGNOSIS — J449 Chronic obstructive pulmonary disease, unspecified: Secondary | ICD-10-CM | POA: Diagnosis present

## 2021-09-01 DIAGNOSIS — E782 Mixed hyperlipidemia: Secondary | ICD-10-CM | POA: Diagnosis not present

## 2021-09-01 HISTORY — DX: Non-ST elevation (NSTEMI) myocardial infarction: I21.4

## 2021-09-01 LAB — CBC WITH DIFFERENTIAL/PLATELET
Abs Immature Granulocytes: 0.02 10*3/uL (ref 0.00–0.07)
Basophils Absolute: 0 10*3/uL (ref 0.0–0.1)
Basophils Relative: 0 %
Eosinophils Absolute: 0.2 10*3/uL (ref 0.0–0.5)
Eosinophils Relative: 3 %
HCT: 48.8 % (ref 39.0–52.0)
Hemoglobin: 16 g/dL (ref 13.0–17.0)
Immature Granulocytes: 0 %
Lymphocytes Relative: 26 %
Lymphs Abs: 1.9 10*3/uL (ref 0.7–4.0)
MCH: 29.5 pg (ref 26.0–34.0)
MCHC: 32.8 g/dL (ref 30.0–36.0)
MCV: 90 fL (ref 80.0–100.0)
Monocytes Absolute: 0.8 10*3/uL (ref 0.1–1.0)
Monocytes Relative: 11 %
Neutro Abs: 4.4 10*3/uL (ref 1.7–7.7)
Neutrophils Relative %: 60 %
Platelets: 173 10*3/uL (ref 150–400)
RBC: 5.42 MIL/uL (ref 4.22–5.81)
RDW: 13.9 % (ref 11.5–15.5)
WBC: 7.4 10*3/uL (ref 4.0–10.5)
nRBC: 0 % (ref 0.0–0.2)

## 2021-09-01 LAB — BASIC METABOLIC PANEL
Anion gap: 10 (ref 5–15)
BUN: 12 mg/dL (ref 8–23)
CO2: 25 mmol/L (ref 22–32)
Calcium: 8.9 mg/dL (ref 8.9–10.3)
Chloride: 97 mmol/L — ABNORMAL LOW (ref 98–111)
Creatinine, Ser: 1.21 mg/dL (ref 0.61–1.24)
GFR, Estimated: >60 mL/min (ref >60)
Glucose, Bld: 255 mg/dL — ABNORMAL HIGH (ref 70–99)
Potassium: 4.5 mmol/L (ref 3.5–5.1)
Sodium: 132 mmol/L — ABNORMAL LOW (ref 135–145)

## 2021-09-01 LAB — TROPONIN I (HIGH SENSITIVITY)
Troponin I (High Sensitivity): 11378 ng/L (ref <18)
Troponin I (High Sensitivity): 13568 ng/L (ref <18)
Troponin I (High Sensitivity): 22846 ng/L (ref <18)

## 2021-09-01 LAB — GLUCOSE, CAPILLARY: Glucose-Capillary: 80 mg/dL (ref 70–99)

## 2021-09-01 LAB — HEMOGLOBIN A1C
Hgb A1c MFr Bld: 11.6 % — ABNORMAL HIGH (ref 4.8–5.6)
Mean Plasma Glucose: 286.22 mg/dL

## 2021-09-01 LAB — RESP PANEL BY RT-PCR (FLU A&B, COVID) ARPGX2
Influenza A by PCR: NEGATIVE
Influenza B by PCR: NEGATIVE
SARS Coronavirus 2 by RT PCR: POSITIVE — AB

## 2021-09-01 SURGERY — CORONARY/GRAFT ACUTE MI REVASCULARIZATION
Anesthesia: LOCAL

## 2021-09-01 MED ORDER — HEPARIN (PORCINE) 25000 UT/250ML-% IV SOLN
1400.0000 [IU]/h | INTRAVENOUS | Status: DC
  Administered 2021-09-01: 19:00:00 1300 [IU]/h via INTRAVENOUS
  Administered 2021-09-03: 07:00:00 1400 [IU]/h via INTRAVENOUS
  Filled 2021-09-01 (×3): qty 250

## 2021-09-01 MED ORDER — INSULIN ASPART PROT & ASPART (70-30 MIX) 100 UNIT/ML ~~LOC~~ SUSP
10.0000 [IU] | Freq: Two times a day (BID) | SUBCUTANEOUS | Status: DC
  Administered 2021-09-02: 10:00:00 10 [IU] via SUBCUTANEOUS
  Filled 2021-09-01 (×2): qty 10

## 2021-09-01 MED ORDER — HEPARIN BOLUS VIA INFUSION
4000.0000 [IU] | Freq: Once | INTRAVENOUS | Status: AC
  Administered 2021-09-01: 19:00:00 4000 [IU] via INTRAVENOUS
  Filled 2021-09-01: qty 4000

## 2021-09-01 MED ORDER — INSULIN ASPART 100 UNIT/ML IJ SOLN
0.0000 [IU] | Freq: Three times a day (TID) | INTRAMUSCULAR | Status: DC
  Administered 2021-09-02: 09:00:00 4 [IU] via SUBCUTANEOUS
  Administered 2021-09-02: 17:00:00 7 [IU] via SUBCUTANEOUS
  Administered 2021-09-02 – 2021-09-03 (×2): 3 [IU] via SUBCUTANEOUS
  Administered 2021-09-03 – 2021-09-04 (×3): 4 [IU] via SUBCUTANEOUS
  Administered 2021-09-04: 13:00:00 11 [IU] via SUBCUTANEOUS
  Administered 2021-09-04 – 2021-09-05 (×2): 7 [IU] via SUBCUTANEOUS
  Administered 2021-09-05: 13:00:00 4 [IU] via SUBCUTANEOUS

## 2021-09-01 MED ORDER — FLUTICASONE PROPIONATE 50 MCG/ACT NA SUSP
1.0000 | Freq: Every day | NASAL | Status: DC
  Administered 2021-09-02 – 2021-09-05 (×4): 1 via NASAL
  Filled 2021-09-01: qty 16

## 2021-09-01 MED ORDER — ASPIRIN EC 81 MG PO TBEC
81.0000 mg | DELAYED_RELEASE_TABLET | Freq: Every day | ORAL | Status: DC
  Administered 2021-09-02 – 2021-09-05 (×4): 81 mg via ORAL
  Filled 2021-09-01 (×3): qty 1

## 2021-09-01 MED ORDER — METOPROLOL TARTRATE 12.5 MG HALF TABLET
12.5000 mg | ORAL_TABLET | Freq: Two times a day (BID) | ORAL | Status: DC
  Administered 2021-09-02 – 2021-09-04 (×6): 12.5 mg via ORAL
  Filled 2021-09-01 (×7): qty 1

## 2021-09-01 MED ORDER — ASPIRIN 325 MG PO TABS: 325.0000 mg | ORAL_TABLET | Freq: Every day | ORAL | Status: DC

## 2021-09-01 MED ORDER — INSULIN ASPART 100 UNIT/ML IJ SOLN
0.0000 [IU] | Freq: Every day | INTRAMUSCULAR | Status: DC
  Administered 2021-09-02: 22:00:00 2 [IU] via SUBCUTANEOUS

## 2021-09-01 MED ORDER — LOSARTAN POTASSIUM 25 MG PO TABS
25.0000 mg | ORAL_TABLET | Freq: Every day | ORAL | Status: DC
  Administered 2021-09-02: 09:00:00 25 mg via ORAL
  Filled 2021-09-01: qty 1

## 2021-09-01 MED ORDER — MOMETASONE FURO-FORMOTEROL FUM 200-5 MCG/ACT IN AERO
2.0000 | INHALATION_SPRAY | Freq: Two times a day (BID) | RESPIRATORY_TRACT | Status: DC
  Administered 2021-09-02 – 2021-09-05 (×7): 2 via RESPIRATORY_TRACT
  Filled 2021-09-01: qty 8.8

## 2021-09-01 MED ORDER — ONDANSETRON HCL 4 MG/2ML IJ SOLN
4.0000 mg | Freq: Four times a day (QID) | INTRAMUSCULAR | Status: DC | PRN
  Administered 2021-09-02: 04:00:00 4 mg via INTRAVENOUS
  Filled 2021-09-01: qty 2

## 2021-09-01 MED ORDER — ACETAMINOPHEN 325 MG PO TABS: 650.0000 mg | ORAL_TABLET | ORAL | Status: DC | PRN

## 2021-09-01 MED ORDER — NITROGLYCERIN 0.4 MG SL SUBL
0.4000 mg | SUBLINGUAL_TABLET | SUBLINGUAL | Status: DC | PRN
  Administered 2021-09-03 (×2): 0.4 mg via SUBLINGUAL
  Filled 2021-09-01: qty 1

## 2021-09-01 NOTE — Consult Note (Addendum)
Cardiology Consultation:   Patient ID: Lance Shepherd MRN: [id number]; DOB: [birth year]  Admit date: 09/01/2021 Date of Consult: 09/01/2021  PCP:  [name], MD   [facility] Providers Cardiologist:  Gypsy Balsam, MD - [place]       Patient Profile:   Lance Shepherd is a 75 y.o. male with a hx of coronary artery disease status post prior PCI of the RCA and LCx in 2016 and subsequent non-STEMI in 2019 with DES to the OM and angioplasty to the distal OM, diabetes mellitus, hyperlipidemia (intolerant to statins), hypertension, venous insufficiency, BPH, COPD, sleep apnea who is being seen 09/01/2021 for the evaluation of chest pain at the request of Dr. Posey Rea.  History of Present Illness:   Mr. Tones has a history of prior stenting to the RCA and LCx in November 2016.  He had a non-STEMI in 4/19.  Cardiac catheterization at [facility] demonstrated patent left main, patent RCA stent, 20% mid LAD stenosis, diffuse 80-90% OM1 stenosis and EF 65%.  He underwent PCI with a 2.25 x 23 mm Xience DES to the mid OM.  He also underwent balloon angioplasty to the distal OM at 2 separate sites that were too small for stenting.  An echocardiogram in January 2022 demonstrated EF 60-65 without wall motion abnormalities and grade 1 diastolic dysfunction.  He had a Myoview 09/11/2020 that demonstrated EF 60%, diaphragmatic attenuation and no ischemia.    He was last seen by Dr. Bing Matter on 08/23/2021.  He was not having chest pain at that time.  He is intolerant to multiple statins and plan was to refer him to the lipid clinic for hyperlipidemia.  The patient started on Molnupiravir for COVID-19 yesterday.  After his first dose, he developed chest pain.  He had chest pain off and on throughout the day yesterday as well as today.  He did take nitroglycerin at home with relief of his chest symptoms.  He did have associated shortness of breath and nausea.  He does not think his chest symptoms remind  him of his previous angina with his non-STEMI in 2019.  He has had a lot of cough and congestion.  He has not had syncope, orthopnea.  His leg edema has been stable.  His symptoms worsened today and he contacted EMS.  There was some concern that he had a STEMI on his electrocardiogram and Dr. Allyson Sabal was contacted.  The patient was given nitroglycerin in route with resolution of his chest pain.  Electrocardiograms were reviewed upon presentation to the emergency room at [facility].  He does not currently have evidence of a STEMI.  He is currently chest pain-free.  Data: Hgb 16 Chest x-ray: Enlargement of cardiac silhouette with pulmonary vascular congestion; no definite acute abnormalities EKG in the ED: Sinus tachycardia, HR 108, right bundle branch block, PVC  Past Medical History:  Diagnosis Date   Acute neck pain 12/21/2019   Allergic rhinitis 11/16/2018   Behavior disturbance 04/10/2020   Benign prostatic hyperplasia without lower urinary tract symptoms 10/09/2015   Cervical discitis 01/01/2020   Chronic obstructive pulmonary disease (HCC) 10/09/2015   Chronic pain of left knee 06/16/2017   Constipation, chronic 04/10/2020   Coronary arteriosclerosis in native artery 08/07/2016   Coronary artery disease stent to RCA and circumflex in November 2017, stent to obtuse marginal branch in 2019 08/07/2016   Diabetes mellitus (HCC) 01/19/2020   Diabetes mellitus type 2 in obese (HCC) 09/13/2019   HTN (hypertension) 01/19/2020  Hyperlipidemia 10/09/2015   Hypertensive heart disease with heart failure (HCC) 10/09/2015   Long-term insulin use (HCC) 08/12/2018   MCI (mild cognitive impairment) 09/13/2019   Medication monitoring encounter 03/01/2020   Mild anxiety 03/14/2016   NSTEMI, initial episode of care [facility](HCC) 12/25/2017   Formatting of this note might be different from the original. Added automatically from request for surgery [id number]   Obesity due to excess calories 12/25/2017   Obstructive sleep apnea syndrome  02/19/2016   Osteomyelitis of cervical spine (HCC) 01/01/2020   Formatting of this note might be different from the original. C5-6   Primary osteoarthritis of right knee 06/23/2017   Sinus tachycardia by electrocardiogram 11/11/2019   Spondylosis of cervical region without myelopathy or radiculopathy 12/21/2019   Statin intolerance 09/13/2019   Statin myopathy 08/20/2020   Status post coronary artery stent placement 01/19/2018    Past Surgical History:  Procedure Laterality Date   cardiacstent placement     COLONOSCOPY     hernia repaired     stent replacement       Home Medications:  Prior to Admission medications   Medication Sig Start Date End Date Taking? Authorizing Provider  aspirin 325 MG tablet Take 325 mg by mouth daily.    [provider]  fluticasone (FLONASE) 50 MCG/ACT nasal spray Place 1 spray into both nostrils daily.    [provider]  Fluticasone-Salmeterol (ADVAIR) 250-50 MCG/DOSE AEPB Inhale 1 puff into the lungs as needed for shortness of breath. 11/06/20   [provider]  furosemide (LASIX) 20 MG tablet Take 20 mg by mouth as needed for fluid or edema.    [provider]  gabapentin (NEURONTIN) 300 MG capsule Take 300 mg by mouth 3 (three) times daily. 06/18/21   [provider]  insulin NPH-regular Human (70-30) 100 UNIT/ML injection Inject 100 Units into the skin 2 (two) times daily with a meal. 12/29/17   [provider]  losartan (COZAAR) 25 MG tablet Take 25 mg by mouth daily. 12/20/19   [provider]  magnesium hydroxide (MILK OF MAGNESIA) 400 MG/5ML suspension Take 5 mLs by mouth daily as needed for mild constipation.    [provider]  tamsulosin (FLOMAX) 0.4 MG CAPS capsule Take 0.4 mg by mouth daily. 12/26/19   [provider]    Inpatient Medications: Scheduled Meds:  Continuous Infusions:  PRN Meds:   Allergies:    Allergies  Allergen Reactions   Ticagrelor Shortness  Of Breath   Pioglitazone Diarrhea   Statins Nausea Only and Rash    Social History:   Social History   Socioeconomic History   Marital status: Married    Spouse name: Not on file   Number of children: Not on file   Years of education: Not on file   Highest education level: Not on file  Occupational History   Not on file  Tobacco Use   Smoking status: Never   Smokeless tobacco: Never  Substance and Sexual Activity   Alcohol use: Never   Drug use: Never   Sexual activity: Not Currently  Other Topics Concern   Not on file  Social History Narrative   Not on file   Social Determinants of Health   Financial Resource Strain: Not on file  Food Insecurity: Not on file  Transportation Needs: Not on file  Physical Activity: Not on file  Stress: Not on file  Social Connections: Not on file  Intimate Partner Violence: Not on file  Family History:    Family History  Problem Relation Age of Onset   COPD Mother    Dementia Mother    Lung cancer Father      ROS:  Please see the history of present illness.  He has not had any melena, hematochezia, hematuria. He has had cough and congestion the last week and was diagnosed with COVID recently (started Molnupiravir yesterday). All other ROS reviewed and negative.     Physical Exam/Data:   Vitals:   09/01/21 1629 09/01/21 1633 09/01/21 1636  BP:  128/90   Pulse:  (!) 102   Resp:  18   Temp:  98.1 F (36.7 C)   TempSrc:  Temporal   SpO2: 97% 96%   Weight:   115 kg  Height:   5\' 10"  (1.778 m)   No intake or output data in the 24 hours ending 09/01/21 1712 Last 3 Weights 09/01/2021 08/23/2021 01/24/2021  Weight (lbs) 253 lb 8.5 oz 254 lb 6.4 oz 241 lb  Weight (kg) 115 kg 115.395 kg 109.317 kg     Body mass index is 36.38 kg/m.  General:  Well nourished, well developed, in no acute distress  HEENT: normal Neck: no JVD Vascular: DP/PT difficult to palpate bilaterally Cardiac:  normal S1, S2; rapid regular rhythm;  no obvious murmur   Lungs:  coarse breath sounds bilaterally, no wheezing  Abd: soft, nontender  Ext: trace bilateral LE edema Musculoskeletal:  No deformities  Skin: warm and dry  Neuro:  CNs 2-12 intact, no focal abnormalities noted Psych:  Normal affect   EKG:  The EKG was personally reviewed and demonstrates:  see HPI   Relevant CV Studies:    Laboratory Data:  High Sensitivity Troponin:  No results for input(s): TROPONINIHS in the last 720 hours.   ChemistryNo results for input(s): NA, K, CL, CO2, GLUCOSE, BUN, CREATININE, CALCIUM, MG, GFRNONAA, GFRAA, ANIONGAP in the last 168 hours.  No results for input(s): PROT, ALBUMIN, AST, ALT, ALKPHOS, BILITOT in the last 168 hours. Lipids No results for input(s): CHOL, TRIG, HDL, LABVLDL, LDLCALC, CHOLHDL in the last 168 hours.  Hematology Recent Labs  Lab 09/01/21 1653  WBC 7.4  RBC 5.42  HGB 16.0  HCT 48.8  MCV 90.0  MCH 29.5  MCHC 32.8  RDW 13.9  PLT 173   Thyroid No results for input(s): TSH, FREET4 in the last 168 hours.  BNPNo results for input(s): BNP, PROBNP in the last 168 hours.  DDimer No results for input(s): DDIMER in the last 168 hours.   Radiology/Studies:  DG Chest Port 1 View  Result Date: 09/01/2021 CLINICAL DATA:  Chest pain and nausea/vomiting since yesterday, hemodynamically stable, symptoms responded to nitroglycerin yesterday but not today EXAM: PORTABLE CHEST 1 VIEW COMPARISON:  Portable exam 1639 hours compared to 12/18/2019 FINDINGS: Minimal enlargement of cardiac silhouette with pulmonary vascular congestion. Mediastinal contours normal. Lordotic positioning. Chronic accentuation of RIGHT perihilar markings. No definite acute infiltrate, pleural effusion, or pneumothorax. Bones demineralized. IMPRESSION: Enlargement of cardiac silhouette with pulmonary vascular congestion. No definite acute abnormalities. Electronically Signed   By: Lavonia Dana M.D.   On: 09/01/2021 16:52     Assessment and Plan:    1.  Unstable angina He presents with chest pain since yesterday when he started taking molnupiravir for COVID-19.  His symptoms are suggestive of angina with associated shortness of breath and nausea and relief with nitroglycerin.  There was some concern for STEMI.  However, his EKG does not  demonstrate clear findings for ST elevation myocardial infarction.  He has an old right bundle branch block which is unchanged.  He has also been seen by Dr. Allyson Sabal who has reviewed his electrocardiograms.  He received nitroglycerin in route by EMS.  He is currently chest pain-free.  There is no indication for STEMI and he will not be taken directly to the cardiac catheterization lab at this time.  -Admit to internal medicine  -Cycle cardiac enzymes  -Aspirin 81 mg daily, metoprolol tartrate 12.5 mg twice daily  -If troponins elevated, start IV heparin  -Patient is intolerant to statins  -Continue other home medications  2.  Coronary artery disease History of PCI to the RCA and LCx in 2016 and non-STEMI in 2019 treated with a DES to the OM.  As noted, he presented to the hospital with chest discomfort concerning for unstable angina pectoris.  His electrocardiogram does demonstrate a right bundle branch block but he is not currently having a STEMI.  As noted, he will be admitted to internal medicine and we will follow.  Cardiac enzymes will be cycled.  He is not currently taking aspirin.  Aspirin 81 mg daily will be added back to his regimen.  He will be placed on low-dose metoprolol tartrate.  3.  Hypertension Diastolic blood pressure is somewhat elevated.  Add metoprolol tartrate 12.5 mg twice daily as noted.  Continue home dose of losartan 25 mg daily.  4.  Hyperlipidemia Patient is intolerant to multiple statins.  His last note with Dr. Bing Matter indicates he is being referred to the lipid clinic.   Risk Assessment/Risk Scores:     TIMI Risk Score for Unstable Angina or Non-ST Elevation MI:   The  patient's TIMI risk score is 4, which indicates a 20% risk of all cause mortality, new or recurrent myocardial infarction or need for urgent revascularization in the next 14 days.     For questions or updates, please contact [facility] Please consult [email or web address] for contact info under    Signed, Demarrio Menges, PA-C  09/01/2021 5:12 PM   agree with note by Tereso Newcomer, PA-C   Mr. Lamay is a 75 year old African-American male patient of Dr. Bing Matter with a history of prior RCA stenting remotely as well as circumflex stenting in 2019 at [facility].  He has normal EF.  Further problems include hypertension, and hyperlipidemia tolerated statin therapy.  He apparently was recently diagnosed with COVID and was placed on Molnipirnavir yesterday and developed chest pain after his second dose.  He has had pain off and on since.  He was given sublingual nitroglycerin in the emergency room and his pain subsided.  He has chronic right bundle branch block which has not changed except for some subtle changes in the lateral leads.  He is currently pain-free.  Exam is benign.  His symptom and EKGs are not consistent with acute coronary syndrome.  Plan admit, cycle enzymes, if positive begin IV heparin.  I suspect this is noncoronary.  We will be happy to follow.  [facility]  Hospitalist to admit.  Runell Gess, M.D., FACP, [facility], [facility] [address] [place], [place]  [address]  [phone] 09/01/2021 5:21 PM

## 2021-09-01 NOTE — Progress Notes (Signed)
°   09/01/21 1700  Clinical Encounter Type  Visited With Patient  Visit Type Trauma;ED;Initial  Referral From Nurse  Consult/Referral To Chaplain   Chaplain responded to STEMI in ED.  Chaplain spoke with pt who was alert and offered support.  No family present.  Chaplain remains available if needed.  Andi Hence, Chaplain  Pager: [phone]

## 2021-09-01 NOTE — H&P (Signed)
History and Physical   Lance Shepherd TDH:[id number] DOB: 1946-07-22 DOA: 09/01/2021  Referring MD/NP/PA: Dr. Posey Rea, [name]  PCP: [name], MD   Outpatient Specialists: Dr. Bing Matter  Patient coming from: Home  Chief Complaint: Chest pain  HPI: Lance Shepherd is a 75 y.o. male with medical history significant of coronary artery disease status post RCA stenting around 2017 2009, diabetes, essential hypertension, hyperlipidemia, BPH, statin intolerance, anxiety disorder, grandmother thinks who was recently diagnosed with COVID and placed on monitor.  IVF.  Patient developed chest pain after his second dose.  The chest pain has been on and off since then.  Patient described the chest pain similar to the one he had prior to his stents placed.  He was brought in by EMS.  In route received nitroglycerin and he is now chest pain-free.  In the ER patient's vitals were stable.  Initial troponin was more than 11,000.  Cardiology consulted.  Recommended initiation of heparin.  Cardiology to follow.  EKG did not show any significant changes or no emergent cardiac cath planned by cardiology..  ED Course: Temperature 98.1 blood pressure 134/84, pulse 103 respiratory 24 oxygen sat 92% on room air.  CBC entirely within normal.  Sodium 132 potassium 4.4 chloride 97 CO2 25 BUN 12 creatinine 1.2 and calcium 8.9.  Troponin is 07/11/1977 and glucose 255.  Viral screen are currently pending.  Chest x-ray shows no acute findings except cardiomegaly.  EKG showed multiform ventricular premature complexes with sinus tachycardia.  Cannot rule out acute infarct.  Patient being admitted to the hospital for further evaluation.  He does have lateral ST depressions and some T wave inversions.  Does not appear to be significantly changed from previous.  Review of Systems: As per HPI otherwise 10 point review of systems negative.    Past Medical History:  Diagnosis Date   Acute neck pain 12/21/2019   Allergic rhinitis  11/16/2018   Behavior disturbance 04/10/2020   Benign prostatic hyperplasia without lower urinary tract symptoms 10/09/2015   Cervical discitis 01/01/2020   Chronic obstructive pulmonary disease (HCC) 10/09/2015   Chronic pain of left knee 06/16/2017   Constipation, chronic 04/10/2020   Coronary arteriosclerosis in native artery 08/07/2016   Coronary artery disease stent to RCA and circumflex in November 2017, stent to obtuse marginal branch in 2019 08/07/2016   Diabetes mellitus (HCC) 01/19/2020   Diabetes mellitus type 2 in obese (HCC) 09/13/2019   HTN (hypertension) 01/19/2020   Hyperlipidemia 10/09/2015   Hypertensive heart disease with heart failure (HCC) 10/09/2015   Long-term insulin use (HCC) 08/12/2018   MCI (mild cognitive impairment) 09/13/2019   Medication monitoring encounter 03/01/2020   Mild anxiety 03/14/2016   NSTEMI, initial episode of care [facility]) 12/25/2017   Formatting of this note might be different from the original. 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 C5-6   Primary osteoarthritis of right knee 06/23/2017   Sinus tachycardia by electrocardiogram 11/11/2019   Spondylosis of cervical region without myelopathy or radiculopathy 12/21/2019   Statin intolerance 09/13/2019   Statin myopathy 08/20/2020   Status post coronary artery stent placement 01/19/2018    Past Surgical History:  Procedure Laterality Date   cardiacstent placement     COLONOSCOPY     hernia repaired  stent replacement       reports that he has never smoked. He has never used smokeless tobacco. He reports that he does not drink alcohol and does not use drugs.  Allergies  Allergen Reactions   Ticagrelor Shortness Of Breath   Pioglitazone Diarrhea   Statins Nausea Only and Rash    Family History   Problem Relation Age of Onset   COPD Mother    Dementia Mother    Lung cancer Father      Prior to Admission medications   Medication Sig Start Date End Date Taking? Authorizing Provider  aspirin 325 MG tablet Take 325 mg by mouth daily.    [provider]  fluticasone (FLONASE) 50 MCG/ACT nasal spray Place 1 spray into both nostrils daily.    [provider]  Fluticasone-Salmeterol (ADVAIR) 250-50 MCG/DOSE AEPB Inhale 1 puff into the lungs as needed for shortness of breath. 11/06/20   [provider]  furosemide (LASIX) 20 MG tablet Take 20 mg by mouth as needed for fluid or edema.    [provider]  gabapentin (NEURONTIN) 300 MG capsule Take 300 mg by mouth 3 (three) times daily. 06/18/21   [provider]  insulin NPH-regular Human (70-30) 100 UNIT/ML injection Inject 100 Units into the skin 2 (two) times daily with a meal. 12/29/17   [provider]  losartan (COZAAR) 25 MG tablet Take 25 mg by mouth daily. 12/20/19   [provider]  magnesium hydroxide (MILK OF MAGNESIA) 400 MG/5ML suspension Take 5 mLs by mouth daily as needed for mild constipation.    [provider]  tamsulosin (FLOMAX) 0.4 MG CAPS capsule Take 0.4 mg by mouth daily. 12/26/19   [provider]    Physical Exam: Vitals:   09/01/21 1830 09/01/21 1845 09/01/21 1900 09/01/21 1945  BP: 115/63 123/88 139/78 113/85  Pulse: 89 (!) 103 99 93  Resp: (!) 24 20 (!) 24 17  Temp:      TempSrc:      SpO2: 95% 92% 95% 95%  Weight:      Height:          Constitutional: NAD, calm, comfortable Vitals:   09/01/21 1830 09/01/21 1845 09/01/21 1900 09/01/21 1945  BP: 115/63 123/88 139/78 113/85  Pulse: 89 (!) 103 99 93  Resp: (!) 24 20 (!) 24 17  Temp:      TempSrc:      SpO2: 95% 92% 95% 95%  Weight:      Height:       Eyes: PERRL, lids and conjunctivae normal ENMT: Mucous membranes are moist. Posterior pharynx clear of any exudate or  lesions.Normal dentition.  Neck: normal, supple, no masses, no thyromegaly Respiratory: clear to auscultation bilaterally, no wheezing, no crackles. Normal respiratory effort. No accessory muscle use.  Cardiovascular: Regular rate and rhythm, no murmurs / rubs / gallops. No extremity edema. 2+ pedal pulses. No carotid bruits.  Abdomen: no tenderness, no masses palpated. No hepatosplenomegaly. Bowel sounds positive.  Musculoskeletal: no clubbing / cyanosis. No joint deformity upper and lower extremities. Good ROM, no contractures. Normal muscle tone.  Skin: no rashes, lesions, ulcers. No induration Neurologic: CN 2-12 grossly intact. Sensation intact, DTR normal. Strength 5/5 in all 4.  Psychiatric: Normal judgment and insight. Alert and oriented x 3. Normal mood.     Labs on Admission: I have personally reviewed following labs and imaging studies  CBC: Recent Labs  Lab 09/01/21 1653  WBC 7.4  NEUTROABS 4.4  HGB 16.0  HCT 48.8  MCV 90.0  PLT 173   Basic Metabolic Panel: Recent Labs  Lab 09/01/21 1653  NA 132*  K 4.5  CL 97*  CO2 25  GLUCOSE 255*  BUN 12  CREATININE 1.21  CALCIUM 8.9   GFR: Estimated Creatinine Clearance: 66 mL/min (by C-G formula based on SCr of 1.21 mg/dL). Liver Function Tests: No results for input(s): AST, ALT, ALKPHOS, BILITOT, PROT, ALBUMIN in the last 168 hours. No results for input(s): LIPASE, AMYLASE in the last 168 hours. No results for input(s): AMMONIA in the last 168 hours. Coagulation Profile: No results for input(s): INR, PROTIME in the last 168 hours. Cardiac Enzymes: No results for input(s): CKTOTAL, CKMB, CKMBINDEX, TROPONINI in the last 168 hours. BNP (last 3 results) No results for input(s): PROBNP in the last 8760 hours. HbA1C: No results for input(s): HGBA1C in the last 72 hours. CBG: No results for input(s): GLUCAP in the last 168 hours. Lipid Profile: No results for input(s): CHOL, HDL, LDLCALC, TRIG, CHOLHDL, LDLDIRECT in  the last 72 hours. Thyroid Function Tests: No results for input(s): TSH, T4TOTAL, FREET4, T3FREE, THYROIDAB in the last 72 hours. Anemia Panel: No results for input(s): VITAMINB12, FOLATE, FERRITIN, TIBC, IRON, RETICCTPCT in the last 72 hours. Urine analysis: No results found for: COLORURINE, APPEARANCEUR, LABSPEC, PHURINE, GLUCOSEU, HGBUR, BILIRUBINUR, KETONESUR, PROTEINUR, UROBILINOGEN, NITRITE, LEUKOCYTESUR Sepsis Labs: @LABRCNTIP (procalcitonin:4,lacticidven:4) ) Recent Results (from the past 240 hour(s))  Resp Panel by RT-PCR (Flu A&B, Covid) Nasopharyngeal Swab     Status: Abnormal   Collection Time: 09/01/21  6:55 PM   Specimen: Nasopharyngeal Swab; Nasopharyngeal(NP) swabs in vial transport medium  Result Value Ref Range Status   SARS Coronavirus 2 by RT PCR POSITIVE (A) NEGATIVE Final    Comment: (NOTE) SARS-CoV-2 target nucleic acids are DETECTED.  The SARS-CoV-2 RNA is generally detectable in upper respiratory specimens during the acute phase of infection. Positive results are indicative of the presence of the identified virus, but do not rule out bacterial infection or co-infection with other pathogens not detected by the test. Clinical correlation with patient history and other diagnostic information is necessary to determine patient infection status. The expected result is Negative.  Fact Sheet for Patients: 09/03/21  Fact Sheet for Healthcare Providers: BloggerCourse.com  This test is not yet approved or cleared by the [employer].[place] FDA and  has been authorized for detection and/or diagnosis of SARS-CoV-2 by FDA under an Emergency Use Authorization (EUA).  This EUA will remain in effect (meaning this test can be used) for the duration of  the COVID-19 declaration under Section 564(b)(1) of the A ct, 21 U.S.C. section 360bbb-3(b)(1), unless the authorization is terminated or revoked sooner.     Influenza A  by PCR NEGATIVE NEGATIVE Final   Influenza B by PCR NEGATIVE NEGATIVE Final    Comment: (NOTE) The Xpert Xpress SARS-CoV-2/FLU/RSV plus assay is intended as an aid in the diagnosis of influenza from Nasopharyngeal swab specimens and should not be used as a sole basis for treatment. Nasal washings and aspirates are unacceptable for Xpert Xpress SARS-CoV-2/FLU/RSV testing.  Fact Sheet for Patients: [place]  Fact Sheet for Healthcare Providers: BloggerCourse.com  This test is not yet approved or cleared by the [employer]ious[employer] FDA and has been authorized for detection and/or diagnosis of SARS-CoV-2 by FDA under an Emergency Use Authorization (EUA). This EUA will remain in effect (meaning this test can be used) for the duration of the COVID-19 declaration under Section 564(b)(1) of the  Act, 21 U.S.C. section 360bbb-3(b)(1), unless the authorization is terminated or revoked.  Performed at [facility], [address] [place], [place] [address]      Radiological Exams on Admission: DG Chest Port 1 View  Result Date: 09/01/2021 CLINICAL DATA:  Chest pain and nausea/vomiting since yesterday, hemodynamically stable, symptoms responded to nitroglycerin yesterday but not today EXAM: PORTABLE CHEST 1 VIEW COMPARISON:  Portable exam 1639 hours compared to 12/18/2019 FINDINGS: Minimal enlargement of cardiac silhouette with pulmonary vascular congestion. Mediastinal contours normal. Lordotic positioning. Chronic accentuation of RIGHT perihilar markings. No definite acute infiltrate, pleural effusion, or pneumothorax. Bones demineralized. IMPRESSION: Enlargement of cardiac silhouette with pulmonary vascular congestion. No definite acute abnormalities. Electronically Signed   By: Ulyses Southward M.D.   On: 09/01/2021 16:52      Assessment/Plan Principal Problem:   NSTEMI (non-ST elevated myocardial infarction) [facility]) Active Problems:    Diabetes mellitus (HCC)   Coronary artery disease stent to RCA and circumflex in November 2017, stent to obtuse marginal branch in 2019   Chronic obstructive pulmonary disease (HCC)   Diabetes mellitus type 2 in obese (HCC)   Hyperlipidemia   Statin intolerance   COVID-19 virus infection     #1 non-ST elevation MI: Patient's chest pain has resolved but markedly elevated troponins.  Could be cardiomyopathy from COVID.  Patient could also have had acute MI as indicated.  Initiation of IV heparin drip.  Cardiology already on board.  Continue beta-blockers and aspirin.  Continue other home regimen.  Patient is intolerant to statins.  We will follow cardiology recommendations.  Last echo was January of this year.  We will order another echocardiogram.  #2 diabetes: Initiate sliding scale insulin.  Continue 70/30 insulin from home.  #3 hyperlipidemia: Intolerant to statins.  Continue to monitor  #4 essential hypertension: Blood pressure appears controlled now.  We will continue losartan from home.  #5 coronary artery disease: Status post previous stenting.  Continue telemetry monitoring in progressive care  #6 COVID-19 infection: Largely asymptomatic.  Patient has been on oral therapy.  We may consider prophylactic remdesivir IV or completed oral therapy.   DVT prophylaxis: Heparin drip Code Status: Full code Family Communication: No family at bedside Disposition Plan: To be determined Consults called: Dr. Nanetta Batty, cardiology Admission status: Inpatient  Severity of Illness: The appropriate patient status for this patient is INPATIENT. Inpatient status is judged to be reasonable and necessary in order to provide the required intensity of service to ensure the patient's safety. The patient's presenting symptoms, physical exam findings, and initial radiographic and laboratory data in the context of their chronic comorbidities is felt to place them at high risk for further clinical  deterioration. Furthermore, it is not anticipated that the patient will be medically stable for discharge from the hospital within 2 midnights of admission.   * I certify that at the point of admission it is my clinical judgment that the patient will require inpatient hospital care spanning beyond 2 midnights from the point of admission due to high intensity of service, high risk for further deterioration and high frequency of surveillance required.Lonia Blood MD [facility] Pager [phone]  If 7PM-7AM, please contact night-coverage [email or web address] Password [facility]  09/01/2021, 8:14 PM

## 2021-09-01 NOTE — Progress Notes (Signed)
ANTICOAGULATION CONSULT NOTE - Initial Consult  Pharmacy Consult for heparin Indication: chest pain/ACS  Allergies  Allergen Reactions   Ticagrelor Shortness Of Breath   Pioglitazone Diarrhea   Statins Nausea Only and Rash    Patient Measurements: Height: 5\' 10"  (177.8 cm) Weight: 115 kg (253 lb 8.5 oz) IBW/kg (Calculated) : 73 Heparin Dosing Weight: 98kg  Vital Signs: Temp: 98.1 F (36.7 C) (12/25 1633) Temp Source: Temporal (12/25 1633) BP: 123/88 (12/25 1845) Pulse Rate: 103 (12/25 1845)  Labs: Recent Labs    09/01/21 1653  HGB 16.0  HCT 48.8  PLT 173  CREATININE 1.21  TROPONINIHS 11,378*    Estimated Creatinine Clearance: 66 mL/min (by C-G formula based on SCr of 1.21 mg/dL).   Medical History: Past Medical History:  Diagnosis Date   Acute neck pain 12/21/2019   Allergic rhinitis 11/16/2018   Behavior disturbance 04/10/2020   Benign prostatic hyperplasia without lower urinary tract symptoms 10/09/2015   Cervical discitis 01/01/2020   Chronic obstructive pulmonary disease (HCC) 10/09/2015   Chronic pain of left knee 06/16/2017   Constipation, chronic 04/10/2020   Coronary arteriosclerosis in native artery 08/07/2016   Coronary artery disease stent to RCA and circumflex in November 2017, stent to obtuse marginal branch in 2019 08/07/2016   Diabetes mellitus (HCC) 01/19/2020   Diabetes mellitus type 2 in obese (HCC) 09/13/2019   HTN (hypertension) 01/19/2020   Hyperlipidemia 10/09/2015   Hypertensive heart disease with heart failure (HCC) 10/09/2015   Long-term insulin use (HCC) 08/12/2018   MCI (mild cognitive impairment) 09/13/2019   Medication monitoring encounter 03/01/2020   Mild anxiety 03/14/2016   NSTEMI, initial episode of care [facility]) 12/25/2017   Formatting of this note might be different from the original. Added automatically from request for surgery [id number]   Obesity due to excess calories 12/25/2017   Obstructive sleep apnea syndrome 02/19/2016   Osteomyelitis of  cervical spine (HCC) 01/01/2020   Formatting of this note might be different from the original. C5-6   Primary osteoarthritis of right knee 06/23/2017   Sinus tachycardia by electrocardiogram 11/11/2019   Spondylosis of cervical region without myelopathy or radiculopathy 12/21/2019   Statin intolerance 09/13/2019   Statin myopathy 08/20/2020   Status post coronary artery stent placement 01/19/2018    Assessment: 75 YOM presenting with CP, hx CAD, he is not on anticoagulation PTA, setting of COVID infection  Goal of Therapy:  Heparin level 0.3-0.7 units/ml Monitor platelets by anticoagulation protocol: Yes   Plan:  Heparin 4000 units IV x 1, and gtt at 1300 units/hr F/u 8 hour heparin level F/u LOT  01/21/2018, PharmD Clinical Pharmacist ED Pharmacist Phone # [phone] 09/01/2021 6:54 PM

## 2021-09-01 NOTE — ED Triage Notes (Signed)
Pt with CP and nausea/vomiting since yesterday, responded to nitro yesterday but not today. EMS gave 1 nitro and 324 ASA. Hemodynamically stable on arrival, Dr Posey Rea and cardiologist at bedside

## 2021-09-01 NOTE — ED Provider Notes (Signed)
[facility] EMERGENCY DEPARTMENT Provider Note   CSN: [id number] Arrival date & time: 09/01/21  1628     History Chief Complaint  Patient presents with   Chest Pain   Nausea    Lance Shepherd is a 75 y.o. male who presents the emergency department for evaluation of chest pain.  Patient has a significant cardiac history with multiple stents placed and the patient states that he is not on any blood thinning medications at this time.  He initially arrives as a STEMI alert with complaints of substernal active crushing chest pain with nausea and vomiting.  Initial ECG in the field with fragmented QRS in the setting of a right bundle branch block but no clear STEMI.  Initial STEMI alert canceled as the patient's chest pain had completely resolved with nitroglycerin.  He arrives with no complaints of chest pain, shortness of breath, Donnell pain, nausea, vomiting or other systemic symptoms.  Of note, patient recently tested positive for COVID in the currently taking Molnupiravir.    Chest Pain Associated symptoms: diaphoresis, nausea and vomiting   Associated symptoms: no abdominal pain, no back pain, no cough, no fever, no palpitations and no shortness of breath       Past Medical History:  Diagnosis Date   Acute neck pain 12/21/2019   Allergic rhinitis 11/16/2018   Behavior disturbance 04/10/2020   Benign prostatic hyperplasia without lower urinary tract symptoms 10/09/2015   Cervical discitis 01/01/2020   Chronic obstructive pulmonary disease ([place]) 10/09/2015   Chronic pain of left knee 06/16/2017   Constipation, chronic 04/10/2020   Coronary arteriosclerosis in native artery 08/07/2016   Coronary artery disease stent to RCA and circumflex in November 2017, stent to obtuse marginal branch in 2019 08/07/2016   Diabetes mellitus ([place]) 01/19/2020   Diabetes mellitus type 2 in obese ([place]) 09/13/2019   HTN (hypertension) 01/19/2020   Hyperlipidemia 10/09/2015   Hypertensive heart  disease with heart failure ([place]) 10/09/2015   Long-term insulin use ([place]) 08/12/2018   MCI (mild cognitive impairment) 09/13/2019   Medication monitoring encounter 03/01/2020   Mild anxiety 03/14/2016   NSTEMI, initial episode of care [facility]) 12/25/2017   Formatting of this note might be different from the original. 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 C5-6   Primary osteoarthritis of right knee 06/23/2017   Sinus tachycardia by electrocardiogram 11/11/2019   Spondylosis of cervical region without myelopathy or radiculopathy 12/21/2019   Statin intolerance 09/13/2019   Statin myopathy 08/20/2020   Status post coronary artery stent placement 01/19/2018    Patient Active Problem List   Diagnosis Date Noted   Statin myopathy 08/20/2020   Behavior disturbance 04/10/2020   Constipation, chronic 04/10/2020   Medication monitoring encounter 03/01/2020   HTN (hypertension) 01/19/2020   Diabetes mellitus ([place]) 01/19/2020   Cervical discitis 01/01/2020   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   Acute neck pain 12/21/2019   Spondylosis of cervical region without myelopathy or radiculopathy 12/21/2019   Sinus tachycardia by electrocardiogram 11/11/2019   Diabetes mellitus type 2 in obese ([place]) 09/13/2019   MCI (mild cognitive impairment) 09/13/2019   Statin intolerance 09/13/2019   Allergic rhinitis 11/16/2018   Long-term insulin use ([place]) 08/12/2018   Status post coronary artery stent placement 01/19/2018   NSTEMI, initial  episode of care [facility]) 12/25/2017   Obesity due to excess calories 12/25/2017   Primary osteoarthritis of right knee 06/23/2017   Chronic pain of left knee 06/16/2017   Coronary artery disease stent to RCA and circumflex in November 2017, stent to obtuse  marginal branch in 2019 08/07/2016   Coronary arteriosclerosis in native artery 08/07/2016   Mild anxiety 03/14/2016   Obstructive sleep apnea syndrome 02/19/2016   Benign prostatic hyperplasia without lower urinary tract symptoms 10/09/2015   Chronic obstructive pulmonary disease ([place]) 10/09/2015   Hyperlipidemia 10/09/2015   Hypertensive heart disease with heart failure ([place]) 10/09/2015    Past Surgical History:  Procedure Laterality Date   cardiacstent placement     COLONOSCOPY     hernia repaired     stent replacement         Family History  Problem Relation Age of Onset   COPD Mother    Dementia Mother    Lung cancer Father     Social History   Tobacco Use   Smoking status: Never   Smokeless tobacco: Never  Substance Use Topics   Alcohol use: Never   Drug use: Never    Home Medications Prior to Admission medications   Medication Sig Start Date End Date Taking? Authorizing Provider  aspirin 325 MG tablet Take 325 mg by mouth daily.    [provider]  fluticasone (FLONASE) 50 MCG/ACT nasal spray Place 1 spray into both nostrils daily.    [provider]  Fluticasone-Salmeterol (ADVAIR) 250-50 MCG/DOSE AEPB Inhale 1 puff into the lungs as needed for shortness of breath. 11/06/20   [provider]  furosemide (LASIX) 20 MG tablet Take 20 mg by mouth as needed for fluid or edema.    [provider]  gabapentin (NEURONTIN) 300 MG capsule Take 300 mg by mouth 3 (three) times daily. 06/18/21   [provider]  insulin NPH-regular Human (70-30) 100 UNIT/ML injection Inject 100 Units into the skin 2 (two) times daily with a meal. 12/29/17   [provider]  losartan (COZAAR) 25 MG tablet Take 25 mg by mouth daily. 12/20/19   [provider]  magnesium hydroxide (MILK OF MAGNESIA) 400 MG/5ML suspension Take 5 mLs by mouth daily as needed for mild constipation.    [provider]  tamsulosin (FLOMAX) 0.4 MG  CAPS capsule Take 0.4 mg by mouth daily. 12/26/19   [provider]    Allergies    Ticagrelor, Pioglitazone, and Statins  Review of Systems   Review of Systems  Constitutional:  Positive for diaphoresis. Negative for chills and fever.  HENT:  Negative for ear pain and sore throat.   Eyes:  Negative for pain and visual disturbance.  Respiratory:  Negative for cough and shortness of breath.   Cardiovascular:  Positive for chest pain. Negative for palpitations.  Gastrointestinal:  Positive for nausea and vomiting. Negative for abdominal pain.  Genitourinary:  Negative for dysuria and hematuria.  Musculoskeletal:  Negative for arthralgias and back pain.  Skin:  Negative for color change and rash.  Neurological:  Negative for seizures and syncope.  All other systems reviewed and are negative.  Physical Exam Updated Vital Signs BP 135/84    Pulse 93    Temp 98.1 F (36.7 C) (Temporal)    Resp 18    Ht 5\' 10"  (1.778 m)    Wt 115 kg    SpO2 93%    BMI 36.38 kg/m   Physical Exam Constitutional:  General: He is not in acute distress.    Appearance: Normal appearance.  HENT:     Head: Normocephalic and atraumatic.     Nose: No congestion or rhinorrhea.  Eyes:     General:        Right eye: No discharge.        Left eye: No discharge.     Extraocular Movements: Extraocular movements intact.     Pupils: Pupils are equal, round, and reactive to light.  Cardiovascular:     Rate and Rhythm: Normal rate and regular rhythm.     Heart sounds: No murmur heard. Pulmonary:     Effort: No respiratory distress.     Breath sounds: No wheezing or rales.  Abdominal:     General: There is no distension.     Tenderness: There is no abdominal tenderness.  Musculoskeletal:        General: Normal range of motion.     Cervical back: Normal range of motion.  Skin:    General: Skin is warm and dry.  Neurological:     General: No focal deficit present.     Mental Status: He is alert.     ED Results / Procedures / Treatments   Labs (all labs ordered are listed, but only abnormal results are displayed) Labs Reviewed  CBC WITH DIFFERENTIAL/PLATELET  BASIC METABOLIC PANEL  CBC  TROPONIN I (HIGH SENSITIVITY)  TROPONIN I (HIGH SENSITIVITY)    EKG None  Radiology DG Chest Port 1 View  Result Date: 09/01/2021 CLINICAL DATA:  Chest pain and nausea/vomiting since yesterday, hemodynamically stable, symptoms responded to nitroglycerin yesterday but not today EXAM: PORTABLE CHEST 1 VIEW COMPARISON:  Portable exam 1639 hours compared to 12/18/2019 FINDINGS: Minimal enlargement of cardiac silhouette with pulmonary vascular congestion. Mediastinal contours normal. Lordotic positioning. Chronic accentuation of RIGHT perihilar markings. No definite acute infiltrate, pleural effusion, or pneumothorax. Bones demineralized. IMPRESSION: Enlargement of cardiac silhouette with pulmonary vascular congestion. No definite acute abnormalities. Electronically Signed   By: Ulyses Southward M.D.   On: 09/01/2021 16:52    Procedures Procedures   Medications Ordered in ED Medications  aspirin EC tablet 81 mg (has no administration in time range)  metoprolol tartrate (LOPRESSOR) tablet 12.5 mg (has no administration in time range)  nitroGLYCERIN (NITROSTAT) SL tablet 0.4 mg (has no administration in time range)    ED Course  I have reviewed the triage vital signs and the nursing notes.  Pertinent labs & imaging results that were available during my care of the patient were reviewed by me and considered in my medical decision making (see chart for details).    MDM Rules/Calculators/A&P                          Patient seen in the emergency department for evaluation of chest pain.  Physical exam is unremarkable.  ECG unchanged from EMS with fragmented QRS unchanged from previous EKGs with a right bundle branch block.  Initial cardiology evaluation recommending medical admission and trending  troponins, opting to treat via symptoms at this time.  Initial laboratory evaluation revealing a significant elevated troponin greater than 11,000.  Every page cardiology and spoke with them who agrees with continued medical admission as is the patient is chest pain-free.  Patient started on IV heparin.  Patient admitted to medicine and delta troponin appears to be uptrending at 13,568.  Cardiology is following closely and the medical team will coordinate with the  cardiology team for discussions on catheterization versus observation.   Final Clinical Impression(s) / ED Diagnoses Final diagnoses:  None    Rx / DC Orders ED Discharge Orders     None        Glennda Weatherholtz, [name], MD 09/01/21 2036

## 2021-09-01 NOTE — ED Notes (Signed)
Cardiology PA Tereso Newcomer and Dr Posey Rea indicating that pt does not need to go to cath lab at this time. VSS, no distress noted.

## 2021-09-01 NOTE — Progress Notes (Signed)
Brief Note:  Lance Shepherd is a 75 yo male with CAD (prior RCA stent), DM, HTN, HL, BPH, anxiety and current COVID infection who presented with chest pain. Pain resolved with NTG. Earlier evaluated by our team and decision made to hold off on emergent cath.   - Patient continues to be chest pain free - Troponins 17356 and 13568  Case discussed with Dr. Allyson Sabal (Interventional Cardiology). Will continue to follow.  Cardiology covering physician Lonie Peak, MD

## 2021-09-02 DIAGNOSIS — I2510 Atherosclerotic heart disease of native coronary artery without angina pectoris: Secondary | ICD-10-CM | POA: Diagnosis not present

## 2021-09-02 DIAGNOSIS — I214 Non-ST elevation (NSTEMI) myocardial infarction: Secondary | ICD-10-CM | POA: Diagnosis not present

## 2021-09-02 DIAGNOSIS — U071 COVID-19: Secondary | ICD-10-CM | POA: Diagnosis not present

## 2021-09-02 DIAGNOSIS — E1169 Type 2 diabetes mellitus with other specified complication: Secondary | ICD-10-CM | POA: Diagnosis not present

## 2021-09-02 LAB — CBC
HCT: 45.8 % (ref 39.0–52.0)
Hemoglobin: 14.9 g/dL (ref 13.0–17.0)
MCH: 29.1 pg (ref 26.0–34.0)
MCHC: 32.5 g/dL (ref 30.0–36.0)
MCV: 89.5 fL (ref 80.0–100.0)
Platelets: 153 10*3/uL (ref 150–400)
RBC: 5.12 MIL/uL (ref 4.22–5.81)
RDW: 13.7 % (ref 11.5–15.5)
WBC: 8.3 10*3/uL (ref 4.0–10.5)
nRBC: 0 % (ref 0.0–0.2)

## 2021-09-02 LAB — GLUCOSE, CAPILLARY
Glucose-Capillary: 173 mg/dL — ABNORMAL HIGH (ref 70–99)
Glucose-Capillary: 148 mg/dL — ABNORMAL HIGH (ref 70–99)
Glucose-Capillary: 205 mg/dL — ABNORMAL HIGH (ref 70–99)
Glucose-Capillary: 221 mg/dL — ABNORMAL HIGH (ref 70–99)

## 2021-09-02 LAB — BRAIN NATRIURETIC PEPTIDE: B Natriuretic Peptide: 140 pg/mL — ABNORMAL HIGH (ref 0.0–100.0)

## 2021-09-02 LAB — HEPARIN LEVEL (UNFRACTIONATED)
Heparin Unfractionated: 0.27 IU/mL — ABNORMAL LOW (ref 0.30–0.70)
Heparin Unfractionated: 0.42 IU/mL (ref 0.30–0.70)

## 2021-09-02 LAB — TROPONIN I (HIGH SENSITIVITY): Troponin I (High Sensitivity): 22153 ng/L (ref <18)

## 2021-09-02 LAB — C-REACTIVE PROTEIN: CRP: 6.8 mg/dL — ABNORMAL HIGH (ref <1.0)

## 2021-09-02 MED ORDER — INSULIN DETEMIR 100 UNIT/ML ~~LOC~~ SOLN
10.0000 [IU] | Freq: Every day | SUBCUTANEOUS | Status: DC
  Administered 2021-09-02 – 2021-09-03 (×2): 10 [IU] via SUBCUTANEOUS
  Filled 2021-09-02 (×3): qty 0.1

## 2021-09-02 MED ORDER — SODIUM CHLORIDE 0.9% FLUSH: 3.0000 mL | INTRAVENOUS | Status: DC | PRN

## 2021-09-02 MED ORDER — SODIUM CHLORIDE 0.9 % IV SOLN: 250.0000 mL | INTRAVENOUS | Status: DC | PRN

## 2021-09-02 MED ORDER — MOLNUPIRAVIR EUA 200MG CAPSULE
4.0000 | ORAL_CAPSULE | Freq: Two times a day (BID) | ORAL | Status: AC
  Administered 2021-09-02 – 2021-09-04 (×5): 800 mg via ORAL
  Filled 2021-09-02 (×2): qty 4

## 2021-09-02 MED ORDER — ASPIRIN 81 MG PO CHEW
81.0000 mg | CHEWABLE_TABLET | ORAL | Status: AC
  Administered 2021-09-03: 05:00:00 81 mg via ORAL
  Filled 2021-09-02: qty 1

## 2021-09-02 MED ORDER — SODIUM CHLORIDE 0.9% FLUSH: 3.0000 mL | Freq: Two times a day (BID) | INTRAVENOUS | Status: DC

## 2021-09-02 MED ORDER — SODIUM CHLORIDE 0.9 % WEIGHT BASED INFUSION
3.0000 mL/kg/h | INTRAVENOUS | Status: AC
  Administered 2021-09-03: 04:00:00 3 mL/kg/h via INTRAVENOUS

## 2021-09-02 MED ORDER — SODIUM CHLORIDE 0.9 % WEIGHT BASED INFUSION
1.0000 mL/kg/h | INTRAVENOUS | Status: DC
  Administered 2021-09-03: 05:00:00 1 mL/kg/h via INTRAVENOUS

## 2021-09-02 NOTE — Progress Notes (Signed)
ANTICOAGULATION CONSULT NOTE - Follow Up Consult  Pharmacy Consult for heparin Indication: chest pain/ACS  Labs: Recent Labs    09/01/21 1653 09/01/21 1830 09/01/21 2212 09/02/21 0046  HGB 16.0  --   --  14.9  HCT 48.8  --   --  45.8  PLT 173  --   --  153  HEPARINUNFRC  --   --   --  0.27*  CREATININE 1.21  --   --   --   TROPONINIHS 36,144* 13,568* 22,846*  --     Assessment: 75yo male subtherapeutic on heparin with initial dosing for CP; no infusion issues or signs of bleeding per RN.  Goal of Therapy:  Heparin level 0.3-0.7 units/ml   Plan:  Will increase heparin infusion by 1 unit/kg/hr to 1400 units/hr and check level in 6 hours.    Lance Shepherd, PharmD, BCPS  09/02/2021,2:19 AM

## 2021-09-02 NOTE — Progress Notes (Signed)
ANTICOAGULATION CONSULT NOTE  Pharmacy Consult for heparin Indication: chest pain/ACS  Allergies  Allergen Reactions   Ticagrelor Shortness Of Breath   Pioglitazone Diarrhea   Statins Nausea Only and Rash    Patient Measurements: Height: 5\' 10"  (177.8 cm) Weight: 113.8 kg (250 lb 14.1 oz) IBW/kg (Calculated) : 73 Heparin Dosing Weight: 98kg  Vital Signs: Temp: 98.4 F (36.9 C) (12/26 0422) Temp Source: Oral (12/26 0422) BP: 105/79 (12/26 0857) Pulse Rate: 80 (12/26 0857)  Labs: Recent Labs    09/01/21 1653 09/01/21 1830 09/01/21 2212 09/02/21 0046 09/02/21 0842  HGB 16.0  --   --  14.9  --   HCT 48.8  --   --  45.8  --   PLT 173  --   --  153  --   HEPARINUNFRC  --   --   --  0.27* 0.42  CREATININE 1.21  --   --   --   --   TROPONINIHS 09/04/21* 13,568* 22,846* 22,153*  --      Estimated Creatinine Clearance: 65.6 mL/min (by C-G formula based on SCr of 1.21 mg/dL).   Medical History: Past Medical History:  Diagnosis Date   Acute neck pain 12/21/2019   Allergic rhinitis 11/16/2018   Behavior disturbance 04/10/2020   Benign prostatic hyperplasia without lower urinary tract symptoms 10/09/2015   Cervical discitis 01/01/2020   Chronic obstructive pulmonary disease (HCC) 10/09/2015   Chronic pain of left knee 06/16/2017   Constipation, chronic 04/10/2020   Coronary arteriosclerosis in native artery 08/07/2016   Coronary artery disease stent to RCA and circumflex in November 2017, stent to obtuse marginal branch in 2019 08/07/2016   Diabetes mellitus (H[facility]) 01/19/2020   Diabetes mellitus type 2 in obese ([facility]) 09/13/2019   HTN (hypertension) 01/19/2020   Hyperlipidemia 10/09/2015   Hypertensive heart disease with heart failure (HCC) 10/09/2015   Long-term insulin use ([facility]) 08/12/2018   MCI (mild cognitive impairment) 09/13/2019   Medication monitoring encounter 03/01/2020   Mild anxiety 03/14/2016   NSTEMI, initial episode of care [facility]) 12/25/2017   Formatting of this note might be  different from the original. 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 C5-6   Primary osteoarthritis of right knee 06/23/2017   Sinus tachycardia by electrocardiogram 11/11/2019   Spondylosis of cervical region without myelopathy or radiculopathy 12/21/2019   Statin intolerance 09/13/2019   Statin myopathy 08/20/2020   Status post coronary artery stent placement 01/19/2018    Assessment: 75 YOM presenting with CP and elevated troponins. Pt started on IV heparin, no AC PTA. Heparin level is therapeutic at 0.42, CBC stable.  Goal of Therapy:  Heparin level 0.3-0.7 units/ml Monitor platelets by anticoagulation protocol: Yes   Plan:  Heparin 1400 units/h Daily heparin level and CBC  01/21/2018, PharmD, [place], [facility] Clinical Pharmacist [phone] Please check AMION for all [facility] Pharmacy numbers 09/02/2021

## 2021-09-02 NOTE — TOC Progression Note (Signed)
Transition of Care [facility]) - Progression Note    Patient Details  Name: Lance Shepherd MRN: [id number] Date of Birth: [birth year]  Transition of Care [facility]) CM/SW Contact  Leone Haven, RN Phone Number: 09/02/2021, 4:17 PM  Clinical Narrative:     Transition of Care [facility]) Screening Note   Patient Details  Name: Lance Shepherd Date of Birth: 05/24/1946   Transition of Care [facility]) CM/SW Contact:    Leone Haven, RN Phone Number: 09/02/2021, 4:17 PM    Transition of Care Department [facility]) has reviewed patient and no TOC needs have been identified at this time. We will continue to monitor patient advancement through interdisciplinary progression rounds. If new patient transition needs arise, please place a TOC consult.          Expected Discharge Plan and Services                                                 Social Determinants of Health (SDOH) Interventions    Readmission Risk Interventions No flowsheet data found.

## 2021-09-02 NOTE — Progress Notes (Signed)
PROGRESS NOTE  Lance Shepherd  NTI:[id number] DOB: February 21, 1946 DOA: 09/01/2021 PCP: [name], MD   Brief Narrative: Lance Shepherd is a 75 y.o. male with a history of CAD, IDT2DM, HTN, HLD, statin intolerance, obesity, recent diagnosis of symptomatic covid-19 infection 12/22, started on molnupiravir who presented to the ED 12/25 with chest pain resolved with NTG found to have elevated and rising cardiac enzymes consistent with NSTEMI. IV heparin was given and patient admitted with plans for cardiac catheterization.  Assessment & Plan: Principal Problem:   NSTEMI (non-ST elevated myocardial infarction) [facility]) Active Problems:   Diabetes mellitus (HCC)   Coronary artery disease stent to RCA and circumflex in November 2017, stent to obtuse marginal branch in 2019   Chronic obstructive pulmonary disease (HCC)   Diabetes mellitus type 2 in obese (HCC)   Hyperlipidemia   Statin intolerance   COVID-19 virus infection  Covid-19 infection: Without evidence of pneumonia, though this is symptomatic all the same. At high risk of progression to pneumonia/severe disease. CRP 6.8.  - Continue molnupiravir started PTA to complete 5 day course (pt reports having taken 5 total BID doses).  - Continue isolation x10 days. Positive test was initially 11/22.   NSTEMI in pt w/CAD s/p PCI RCA, LCx Nov 2016 and DES to mid-OM, and angioplasty to distal OM April 2019, HLD: Troponin has peaked at 22k, and chest pain subsided.  - Continue heparin - ASA, BB, prn NTG - Refer to lipid clinic due to statin intolerance. Last LDL 108.  HTN: Continue ARB.   IDT2DM: Uncontrolled with HbA1c 11.6%.  - Convert 70/30 to basal-bolus while admitted. Levemir 10u qHS, resistant SSI, HS coverage.   RBBB, PVCs:  - Optimize electrolytes, monitoring.  Pulmonary vascular congestion, cardiomegaly:  - Check echocardiogram, may benefit from diuretic, though caution with marginal renal function and need for cardiac  catheterization.  Obesity: Estimated body mass index is 36 kg/m as calculated from the following:   Height as of this encounter: 5\' 10"  (1.778 m).   Weight as of this encounter: 113.8 kg.  DVT prophylaxis: Heparin IV Code Status: Full Family Communication: Daughter at bedside Disposition Plan:  Status is: Inpatient  Remains inpatient appropriate because: Needs cath, echo, possible diuresis, continuous heparin  Consultants:  Cardiology  Procedures:  Cardiac catheterization planned 09/03/2021  Antimicrobials: Mulnupiravir   Subjective: Chest pain subsided, still has scratchy throat, minimal cough. No dyspnea currently.  Objective: Vitals:   09/02/21 0039 09/02/21 0422 09/02/21 0857 09/02/21 1250  BP: 110/73 (!) 126/91 105/79 113/74  Pulse: (!) 101 95 80 96  Resp: (!) 23 20  17   Temp:  98.4 F (36.9 C)  98.3 F (36.8 C)  TempSrc:  Oral  Oral  SpO2: 97% 95%  97%  Weight:      Height:       No intake or output data in the 24 hours ending 09/02/21 1604 Filed Weights   09/01/21 1636 09/01/21 2300  Weight: 115 kg 113.8 kg    Gen: 75 y.o. male in no distress Pulm: Non-labored breathing supplemental oxygen. Clear to auscultation bilaterally.  CV: Regular rate and rhythm. Distant sounds without definite murmur, rub, or gallop. No definite JVD, no pitting pedal edema. GI: Abdomen soft, non-tender, non-distended, with normoactive bowel sounds. No organomegaly or masses felt. Ext: Warm, no deformities Skin: No rashes, lesions or ulcers on visualized skin. Neuro: Alert and oriented. No focal neurological deficits. Psych: Judgement and insight appear normal. Mood & affect appropriate.  Data Reviewed: I have personally reviewed following labs and imaging studies  CBC: Recent Labs  Lab 09/01/21 1653 09/02/21 0046  WBC 7.4 8.3  NEUTROABS 4.4  --   HGB 16.0 14.9  HCT 48.8 45.8  MCV 90.0 89.5  PLT 173 153   Basic Metabolic Panel: Recent Labs  Lab 09/01/21 1653   NA 132*  K 4.5  CL 97*  CO2 25  GLUCOSE 255*  BUN 12  CREATININE 1.21  CALCIUM 8.9   GFR: Estimated Creatinine Clearance: 65.6 mL/min (by C-G formula based on SCr of 1.21 mg/dL). Liver Function Tests: No results for input(s): AST, ALT, ALKPHOS, BILITOT, PROT, ALBUMIN in the last 168 hours. No results for input(s): LIPASE, AMYLASE in the last 168 hours. No results for input(s): AMMONIA in the last 168 hours. Coagulation Profile: No results for input(s): INR, PROTIME in the last 168 hours. Cardiac Enzymes: No results for input(s): CKTOTAL, CKMB, CKMBINDEX, TROPONINI in the last 168 hours. BNP (last 3 results) No results for input(s): PROBNP in the last 8760 hours. HbA1C: Recent Labs    09/01/21 2112  HGBA1C 11.6*   CBG: Recent Labs  Lab 09/01/21 2231 09/02/21 0845 09/02/21 1248  GLUCAP 80 173* 148*   Lipid Profile: No results for input(s): CHOL, HDL, LDLCALC, TRIG, CHOLHDL, LDLDIRECT in the last 72 hours. Thyroid Function Tests: No results for input(s): TSH, T4TOTAL, FREET4, T3FREE, THYROIDAB in the last 72 hours. Anemia Panel: No results for input(s): VITAMINB12, FOLATE, FERRITIN, TIBC, IRON, RETICCTPCT in the last 72 hours. Urine analysis: No results found for: COLORURINE, APPEARANCEUR, LABSPEC, PHURINE, GLUCOSEU, HGBUR, BILIRUBINUR, KETONESUR, PROTEINUR, UROBILINOGEN, NITRITE, LEUKOCYTESUR Recent Results (from the past 240 hour(s))  Resp Panel by RT-PCR (Flu A&B, Covid) Nasopharyngeal Swab     Status: Abnormal   Collection Time: 09/01/21  6:55 PM   Specimen: Nasopharyngeal Swab; Nasopharyngeal(NP) swabs in vial transport medium  Result Value Ref Range Status   SARS Coronavirus 2 by RT PCR POSITIVE (A) NEGATIVE Final    Comment: (NOTE) SARS-CoV-2 target nucleic acids are DETECTED.  The SARS-CoV-2 RNA is generally detectable in upper respiratory specimens during the acute phase of infection. Positive results are indicative of the presence of the identified  virus, but do not rule out bacterial infection or co-infection with other pathogens not detected by the test. Clinical correlation with patient history and other diagnostic information is necessary to determine patient infection status. The expected result is Negative.  Fact Sheet for Patients: BloggerCourse.com  Fact Sheet for Healthcare Providers: SeriousBroker.it  This test is not yet approved or cleared by the [place] FDA and  has been authorized for detection and/or diagnosis of SARS-CoV-2 by FDA under an Emergency Use Authorization (EUA).  This EUA will remain in effect (meaning this test can be used) for the duration of  the COVID-19 declaration under Section 564(b)(1) of the A ct, 21 U.S.C. section 360bbb-3(b)(1), unless the authorization is terminated or revoked sooner.     Influenza A by PCR NEGATIVE NEGATIVE Final   Influenza B by PCR NEGATIVE NEGATIVE Final    Comment: (NOTE) The Xpert Xpress SARS-CoV-2/FLU/RSV plus assay is intended as an aid in the diagnosis of influenza from Nasopharyngeal swab specimens and should not be used as a sole basis for treatment. Nasal washings and aspirates are unacceptable for Xpert Xpress SARS-CoV-2/FLU/RSV testing.  Fact Sheet for Patients: BloggerCourse.com  Fact Sheet for Healthcare Providers: SeriousBroker.it  This test is not yet approved or cleared by the [place] and  has been authorized for detection and/or diagnosis of SARS-CoV-2 by FDA under an Emergency Use Authorization (EUA). This EUA will remain in effect (meaning this test can be used) for the duration of the COVID-19 declaration under Section 564(b)(1) of the Act, 21 U.S.C. section 360bbb-3(b)(1), unless the authorization is terminated or revoked.  Performed at [facility], [address], [place], [place] [address]       Radiology  Studies: DG Chest Port 1 View  Result Date: 09/01/2021 CLINICAL DATA:  Chest pain and nausea/vomiting since yesterday, hemodynamically stable, symptoms responded to nitroglycerin yesterday but not today EXAM: PORTABLE CHEST 1 VIEW COMPARISON:  Portable exam 1639 hours compared to 12/18/2019 FINDINGS: Minimal enlargement of cardiac silhouette with pulmonary vascular congestion. Mediastinal contours normal. Lordotic positioning. Chronic accentuation of RIGHT perihilar markings. No definite acute infiltrate, pleural effusion, or pneumothorax. Bones demineralized. IMPRESSION: Enlargement of cardiac silhouette with pulmonary vascular congestion. No definite acute abnormalities. Electronically Signed   By: Ulyses Southward M.D.   On: 09/01/2021 16:52    Scheduled Meds:  aspirin EC  81 mg Oral Daily   fluticasone  1 spray Each Nare Daily   insulin aspart  0-20 Units Subcutaneous TID WC   insulin aspart  0-5 Units Subcutaneous QHS   insulin aspart protamine- aspart  10 Units Subcutaneous BID WC   losartan  25 mg Oral Daily   metoprolol tartrate  12.5 mg Oral BID   molnupiravir EUA  4 capsule Oral BID   mometasone-formoterol  2 puff Inhalation BID   Continuous Infusions:  heparin 1,400 Units/hr (09/02/21 0415)     LOS: 1 day   Time spent: 35 minutes.  Tyrone Nine, MD [facility] [email or web address] 09/02/2021, 4:04 PM

## 2021-09-02 NOTE — Progress Notes (Signed)
Progress Note  Patient Name: Lance Shepherd Date of Encounter: 09/02/2021  [facility] Cardiologist: Gypsy Balsam, MD   Subjective   Several hours of chest pain relieved by sublingual nitroglycerin on presentation.  Chest pain started after second dose of oral antiviral therapy.  Troponin levels are significantly elevated similarly peaking at 12/25, 10 PM, value 22,846.  Subsequent troponin early this a.m, 09/02/2021, 22,153  Inpatient Medications    Scheduled Meds:  aspirin EC  81 mg Oral Daily   fluticasone  1 spray Each Nare Daily   insulin aspart  0-20 Units Subcutaneous TID WC   insulin aspart  0-5 Units Subcutaneous QHS   insulin aspart protamine- aspart  10 Units Subcutaneous BID WC   losartan  25 mg Oral Daily   metoprolol tartrate  12.5 mg Oral BID   molnupiravir EUA  4 capsule Oral BID   mometasone-formoterol  2 puff Inhalation BID   Continuous Infusions:  heparin 1,400 Units/hr (09/02/21 0415)   PRN Meds: acetaminophen, nitroGLYCERIN, ondansetron (ZOFRAN) IV   Vital Signs    Vitals:   09/02/21 0038 09/02/21 0039 09/02/21 0422 09/02/21 0857  BP: 110/73 110/73 (!) 126/91 105/79  Pulse: (!) 106 (!) 101 95 80  Resp: 18 (!) 23 20   Temp:   98.4 F (36.9 C)   TempSrc:   Oral   SpO2: 96% 97% 95%   Weight:      Height:       No intake or output data in the 24 hours ending 09/02/21 1057 Last 3 Weights 09/01/2021 09/01/2021 08/23/2021  Weight (lbs) 250 lb 14.1 oz 253 lb 8.5 oz 254 lb 6.4 oz  Weight (kg) 113.8 kg 115 kg 115.395 kg      Telemetry    Sinus with occasional PVCs- Personally Reviewed  ECG    Performed on 09/01/2021 at 4:30 PM 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  Possible subtle ST elevation in V5, V6, and aVL.- Personally Reviewed  Physical Exam  Obese African-American male GEN: No acute distress.   Neck: No JVD Cardiac: RRR, no murmurs, rubs, but with possible soft S3 gallop.  Gallops.   Respiratory: Clear to auscultation bilaterally. GI: Soft, nontender, non-distended  MS: No edema; No deformity. Neuro:  Nonfocal  Psych: Normal affect   Labs    High Sensitivity Troponin:   Recent Labs  Lab 09/01/21 1653 09/01/21 1830 09/01/21 2212 09/02/21 0046  TROPONINIHS 11,378* 13,568* 22,846* 22,153*     Chemistry Recent Labs  Lab 09/01/21 1653  NA 132*  K 4.5  CL 97*  CO2 25  GLUCOSE 255*  BUN 12  CREATININE 1.21  CALCIUM 8.9  GFRNONAA >60  ANIONGAP 10    Lipids No results for input(s): CHOL, TRIG, HDL, LABVLDL, LDLCALC, CHOLHDL in the last 168 hours.  Hematology Recent Labs  Lab 09/01/21 1653 09/02/21 0046  WBC 7.4 8.3  RBC 5.42 5.12  HGB 16.0 14.9  HCT 48.8 45.8  MCV 90.0 89.5  MCH 29.5 29.1  MCHC 32.8 32.5  RDW 13.9 13.7  PLT 173 153   Thyroid No results for input(s): TSH, FREET4 in the last 168 hours.  BNPNo results for input(s): BNP, PROBNP in the last 168 hours.  DDimer No results for input(s): DDIMER in the last 168 hours.   Radiology    DG Chest Port 1 View  Result Date: 09/01/2021 CLINICAL DATA:  Chest pain and nausea/vomiting since yesterday, hemodynamically stable, symptoms responded to nitroglycerin yesterday  but not today EXAM: PORTABLE CHEST 1 VIEW COMPARISON:  Portable exam 1639 hours compared to 12/18/2019 FINDINGS: Minimal enlargement of cardiac silhouette with pulmonary vascular congestion. Mediastinal contours normal. Lordotic positioning. Chronic accentuation of RIGHT perihilar markings. No definite acute infiltrate, pleural effusion, or pneumothorax. Bones demineralized. IMPRESSION: Enlargement of cardiac silhouette with pulmonary vascular congestion. No definite acute abnormalities. Electronically Signed   By: Ulyses Southward M.D.   On: 09/01/2021 16:52    Cardiac Studies   See chest x-ray report above.  Patient Profile     74 y.o. male Lance Shepherd is a 75 y.o. male with a hx of coronary artery disease status post prior PCI  of the RCA and LCx in 2016 and subsequent non-STEMI in 2019 with DES to the OM and angioplasty to the distal OM, diabetes mellitus II, hyperlipidemia (intolerant to statins), hypertension, venous insufficiency, BPH, COPD, sleep apnea who is being seen 09/01/2021 for the evaluation of chest pain at the request of Dr. Posey Rea.    Assessment & Plan    Non-ST elevation myocardial infarction: Significant troponin elevation consistent with possible completed infarction as troponins have peaked.  Rule out myocardial inflammatory disease related to COVID-19.  Will require coronary angiography to define anatomy and help guide therapy.  N.p.o. after midnight for coronary angiography tomorrow. Coronary atherosclerotic heart disease: Prior history of both circumflex and right coronary stenting, most recently 2019.  Risk factors are currently out of control including an A1c greater than 11 and LDL cholesterol 108.  HDL is 34. Primary hypertension with target blood pressure 130/80 mmHg or less. Hyperlipidemia with target LDL less than 70: Diabetes mellitus II: S3 gallop and pulmonary congestion: We will check BNP.  May need diuretic therapy.  Not currently complaining of significant dyspnea.  2D Doppler echocardiogram should be performed.  Start hydration oh 4 AM.  Need to be careful not to over hydrate.  The patient was counseled to undergo left heart catheterization, coronary angiography, and possible percutaneous coronary intervention with stent implantation. The procedural risks and benefits were discussed in detail. The risks discussed included death, stroke, myocardial infarction, life-threatening bleeding, limb ischemia, kidney injury, allergy, and possible emergency cardiac surgery. The risk of these significant complications were estimated to occur less than 1% of the time. After discussion, the patient has agreed to proceed.    For questions or updates, please contact [facility] Please consult  [email or web address] for contact info under        Signed, Lesleigh Noe, MD  09/02/2021, 10:57 AM

## 2021-09-03 ENCOUNTER — Other Ambulatory Visit (HOSPITAL_COMMUNITY): Payer: Self-pay

## 2021-09-03 ENCOUNTER — Inpatient Hospital Stay (HOSPITAL_COMMUNITY)
Admission: EM | Disposition: A | Discharge status: Home / Self Care | Payer: Self-pay | Source: Home / Self Care | Attending: Family Medicine

## 2021-09-03 ENCOUNTER — Inpatient Hospital Stay (HOSPITAL_COMMUNITY): Payer: Medicare Other

## 2021-09-03 DIAGNOSIS — E1169 Type 2 diabetes mellitus with other specified complication: Secondary | ICD-10-CM

## 2021-09-03 DIAGNOSIS — I2510 Atherosclerotic heart disease of native coronary artery without angina pectoris: Secondary | ICD-10-CM | POA: Diagnosis not present

## 2021-09-03 DIAGNOSIS — I214 Non-ST elevation (NSTEMI) myocardial infarction: Secondary | ICD-10-CM

## 2021-09-03 DIAGNOSIS — J441 Chronic obstructive pulmonary disease with (acute) exacerbation: Secondary | ICD-10-CM

## 2021-09-03 DIAGNOSIS — U071 COVID-19: Secondary | ICD-10-CM | POA: Diagnosis not present

## 2021-09-03 DIAGNOSIS — E782 Mixed hyperlipidemia: Secondary | ICD-10-CM

## 2021-09-03 DIAGNOSIS — Z789 Other specified health status: Secondary | ICD-10-CM

## 2021-09-03 DIAGNOSIS — I5021 Acute systolic (congestive) heart failure: Secondary | ICD-10-CM | POA: Diagnosis not present

## 2021-09-03 DIAGNOSIS — I25110 Atherosclerotic heart disease of native coronary artery with unstable angina pectoris: Secondary | ICD-10-CM

## 2021-09-03 DIAGNOSIS — E669 Obesity, unspecified: Secondary | ICD-10-CM

## 2021-09-03 HISTORY — PX: CORONARY ANGIOGRAPHY: CATH118303

## 2021-09-03 HISTORY — PX: CORONARY STENT INTERVENTION: CATH118234

## 2021-09-03 LAB — CBC
HCT: 45.8 % (ref 39.0–52.0)
Hemoglobin: 14.9 g/dL (ref 13.0–17.0)
MCH: 29.2 pg (ref 26.0–34.0)
MCHC: 32.5 g/dL (ref 30.0–36.0)
MCV: 89.8 fL (ref 80.0–100.0)
Platelets: 171 10*3/uL (ref 150–400)
RBC: 5.1 MIL/uL (ref 4.22–5.81)
RDW: 14 % (ref 11.5–15.5)
WBC: 7.2 10*3/uL (ref 4.0–10.5)
nRBC: 0 % (ref 0.0–0.2)

## 2021-09-03 LAB — URINALYSIS, COMPLETE (UACMP) WITH MICROSCOPIC
Bilirubin Urine: NEGATIVE
Glucose, UA: NEGATIVE mg/dL
Ketones, ur: NEGATIVE mg/dL
Nitrite: POSITIVE — AB
Protein, ur: NEGATIVE mg/dL
RBC / HPF: >50 RBC/hpf (ref 0–5)
Specific Gravity, Urine: 1.01 (ref 1.005–1.030)
pH: 7.5 (ref 5.0–8.0)

## 2021-09-03 LAB — GLUCOSE, CAPILLARY
Glucose-Capillary: 194 mg/dL — ABNORMAL HIGH (ref 70–99)
Glucose-Capillary: 170 mg/dL — ABNORMAL HIGH (ref 70–99)
Glucose-Capillary: 132 mg/dL — ABNORMAL HIGH (ref 70–99)

## 2021-09-03 LAB — ECHOCARDIOGRAM COMPLETE
Area-P 1/2: 2.39 cm2
Calc EF: 31.3 %
Height: 70 in
S' Lateral: 3.2 cm
Single Plane A2C EF: 34.3 %
Single Plane A4C EF: 29.4 %
Weight: 4014.14 oz

## 2021-09-03 LAB — COMPREHENSIVE METABOLIC PANEL
ALT: 31 U/L (ref 0–44)
AST: 44 U/L — ABNORMAL HIGH (ref 15–41)
Albumin: 2.9 g/dL — ABNORMAL LOW (ref 3.5–5.0)
Alkaline Phosphatase: 55 U/L (ref 38–126)
Anion gap: 10 (ref 5–15)
BUN: 17 mg/dL (ref 8–23)
CO2: 25 mmol/L (ref 22–32)
Calcium: 8.7 mg/dL — ABNORMAL LOW (ref 8.9–10.3)
Chloride: 100 mmol/L (ref 98–111)
Creatinine, Ser: 1.29 mg/dL — ABNORMAL HIGH (ref 0.61–1.24)
GFR, Estimated: 57 mL/min — ABNORMAL LOW (ref >60)
Glucose, Bld: 212 mg/dL — ABNORMAL HIGH (ref 70–99)
Potassium: 4.8 mmol/L (ref 3.5–5.1)
Sodium: 135 mmol/L (ref 135–145)
Total Bilirubin: 1.1 mg/dL (ref 0.3–1.2)
Total Protein: 6.2 g/dL — ABNORMAL LOW (ref 6.5–8.1)

## 2021-09-03 LAB — POCT ACTIVATED CLOTTING TIME: Activated Clotting Time: 311 seconds

## 2021-09-03 LAB — HEPARIN LEVEL (UNFRACTIONATED): Heparin Unfractionated: 0.39 IU/mL (ref 0.30–0.70)

## 2021-09-03 SURGERY — CORONARY STENT INTERVENTION
Anesthesia: LOCAL

## 2021-09-03 MED ORDER — FAMOTIDINE IN NACL 20-0.9 MG/50ML-% IV SOLN
20.0000 mg | Freq: Once | INTRAVENOUS | Status: DC
  Filled 2021-09-03: qty 50

## 2021-09-03 MED ORDER — SODIUM CHLORIDE 0.9 % IV SOLN: 250.0000 mL | INTRAVENOUS | Status: DC | PRN

## 2021-09-03 MED ORDER — CLOPIDOGREL BISULFATE 75 MG PO TABS
75.0000 mg | ORAL_TABLET | Freq: Every day | ORAL | Status: DC
  Administered 2021-09-04 – 2021-09-05 (×2): 75 mg via ORAL
  Filled 2021-09-03 (×2): qty 1

## 2021-09-03 MED ORDER — PERFLUTREN LIPID MICROSPHERE
1.0000 mL | INTRAVENOUS | Status: AC | PRN
Start: 2021-09-03 — End: 2021-09-03
  Administered 2021-09-03: 15:00:00 3 mL via INTRAVENOUS
  Filled 2021-09-03: qty 10

## 2021-09-03 MED ORDER — VERAPAMIL HCL 2.5 MG/ML IV SOLN
INTRAVENOUS | Status: DC | PRN
  Administered 2021-09-03: 17:00:00 10 mL via INTRA_ARTERIAL

## 2021-09-03 MED ORDER — TIROFIBAN HCL IN NACL 5-0.9 MG/100ML-% IV SOLN
INTRAVENOUS | Status: AC
  Filled 2021-09-03: qty 100

## 2021-09-03 MED ORDER — FENTANYL CITRATE (PF) 100 MCG/2ML IJ SOLN
INTRAMUSCULAR | Status: DC | PRN
  Administered 2021-09-03: 25 ug via INTRAVENOUS

## 2021-09-03 MED ORDER — NITROGLYCERIN 1 MG/10 ML FOR IR/CATH LAB
INTRA_ARTERIAL | Status: AC
  Filled 2021-09-03: qty 10

## 2021-09-03 MED ORDER — LABETALOL HCL 5 MG/ML IV SOLN: 10.0000 mg | INTRAVENOUS | Status: AC | PRN

## 2021-09-03 MED ORDER — VERAPAMIL HCL 2.5 MG/ML IV SOLN
INTRAVENOUS | Status: AC
  Filled 2021-09-03: qty 2

## 2021-09-03 MED ORDER — IOHEXOL 350 MG/ML SOLN
INTRAVENOUS | Status: DC | PRN
  Administered 2021-09-03: 18:00:00 145 mL via INTRA_ARTERIAL

## 2021-09-03 MED ORDER — HEPARIN (PORCINE) IN NACL 1000-0.9 UT/500ML-% IV SOLN
INTRAVENOUS | Status: DC | PRN
  Administered 2021-09-03 (×2): 500 mL

## 2021-09-03 MED ORDER — SODIUM CHLORIDE 0.9% FLUSH
3.0000 mL | Freq: Two times a day (BID) | INTRAVENOUS | Status: DC
  Administered 2021-09-04: 23:00:00 3 mL via INTRAVENOUS

## 2021-09-03 MED ORDER — LOSARTAN POTASSIUM 25 MG PO TABS
25.0000 mg | ORAL_TABLET | Freq: Every day | ORAL | Status: DC
  Administered 2021-09-04 – 2021-09-05 (×2): 25 mg via ORAL
  Filled 2021-09-03 (×2): qty 1

## 2021-09-03 MED ORDER — SODIUM CHLORIDE 0.9% FLUSH: 3.0000 mL | INTRAVENOUS | Status: DC | PRN

## 2021-09-03 MED ORDER — HEPARIN SODIUM (PORCINE) 1000 UNIT/ML IJ SOLN
INTRAMUSCULAR | Status: DC | PRN
  Administered 2021-09-03: 10000 [IU] via INTRAVENOUS

## 2021-09-03 MED ORDER — TIROFIBAN (AGGRASTAT) BOLUS VIA INFUSION
INTRAVENOUS | Status: DC | PRN
  Administered 2021-09-03: 17:00:00 2845 ug via INTRAVENOUS

## 2021-09-03 MED ORDER — LIDOCAINE HCL (PF) 1 % IJ SOLN
INTRAMUSCULAR | Status: AC
  Filled 2021-09-03: qty 30

## 2021-09-03 MED ORDER — HEPARIN (PORCINE) IN NACL 1000-0.9 UT/500ML-% IV SOLN
INTRAVENOUS | Status: AC
  Filled 2021-09-03: qty 1000

## 2021-09-03 MED ORDER — MIDAZOLAM HCL 2 MG/2ML IJ SOLN
INTRAMUSCULAR | Status: AC
  Filled 2021-09-03: qty 2

## 2021-09-03 MED ORDER — CLOPIDOGREL BISULFATE 300 MG PO TABS
ORAL_TABLET | ORAL | Status: DC | PRN
  Administered 2021-09-03: 600 mg via ORAL

## 2021-09-03 MED ORDER — SODIUM CHLORIDE 0.9 % IV SOLN: INTRAVENOUS | Status: AC

## 2021-09-03 MED ORDER — HEPARIN SODIUM (PORCINE) 1000 UNIT/ML IJ SOLN
INTRAMUSCULAR | Status: AC
  Filled 2021-09-03: qty 10

## 2021-09-03 MED ORDER — HEPARIN SODIUM (PORCINE) 5000 UNIT/ML IJ SOLN
5000.0000 [IU] | Freq: Three times a day (TID) | INTRAMUSCULAR | Status: DC
  Administered 2021-09-03 – 2021-09-05 (×5): 5000 [IU] via SUBCUTANEOUS
  Filled 2021-09-03 (×5): qty 1

## 2021-09-03 MED ORDER — CLOPIDOGREL BISULFATE 300 MG PO TABS
ORAL_TABLET | ORAL | Status: AC
  Filled 2021-09-03: qty 2

## 2021-09-03 MED ORDER — LIDOCAINE HCL (PF) 1 % IJ SOLN
INTRAMUSCULAR | Status: DC | PRN
  Administered 2021-09-03: 2 mL

## 2021-09-03 MED ORDER — NITROGLYCERIN 1 MG/10 ML FOR IR/CATH LAB
INTRA_ARTERIAL | Status: DC | PRN
  Administered 2021-09-03 (×2): 200 ug via INTRACORONARY

## 2021-09-03 MED ORDER — FENTANYL CITRATE (PF) 100 MCG/2ML IJ SOLN
INTRAMUSCULAR | Status: AC
  Filled 2021-09-03: qty 2

## 2021-09-03 MED ORDER — SODIUM CHLORIDE 0.9 % IV SOLN
1.0000 g | INTRAVENOUS | Status: DC
  Administered 2021-09-03 – 2021-09-05 (×3): 1 g via INTRAVENOUS
  Filled 2021-09-03 (×3): qty 10

## 2021-09-03 MED ORDER — TAMSULOSIN HCL 0.4 MG PO CAPS
0.4000 mg | ORAL_CAPSULE | Freq: Every day | ORAL | Status: DC
  Administered 2021-09-03 – 2021-09-05 (×3): 0.4 mg via ORAL
  Filled 2021-09-03 (×3): qty 1

## 2021-09-03 MED ORDER — MIDAZOLAM HCL 2 MG/2ML IJ SOLN
INTRAMUSCULAR | Status: DC | PRN
  Administered 2021-09-03: 1 mg via INTRAVENOUS

## 2021-09-03 MED ORDER — HYDRALAZINE HCL 20 MG/ML IJ SOLN: 10.0000 mg | INTRAMUSCULAR | Status: AC | PRN

## 2021-09-03 SURGICAL SUPPLY — 19 items
BALLN EMERGE MR 2.5X12 (BALLOONS) ×6
BALLN SAPPHIRE ~~LOC~~ 3.25X15 (BALLOONS) ×2 IMPLANT
BALLOON EMERGE MR 2.5X12 (BALLOONS) IMPLANT
CATH 5FR JL3.5 JR4 ANG PIG MP (CATHETERS) ×2 IMPLANT
CATH LAUNCHER 6FR EBU3.5 (CATHETERS) ×2 IMPLANT
DEVICE RAD COMP TR BAND LRG (VASCULAR PRODUCTS) ×2 IMPLANT
GLIDESHEATH SLEND SS 6F .021 (SHEATH) ×2 IMPLANT
GUIDEWIRE ANGLED .035X260CM (WIRE) ×2 IMPLANT
GUIDEWIRE INQWIRE 1.5J.035X260 (WIRE) IMPLANT
INQWIRE 1.5J .035X260CM (WIRE) ×6
KIT ENCORE 26 ADVANTAGE (KITS) ×2 IMPLANT
KIT HEART LEFT (KITS) ×8 IMPLANT
KIT HEMO VALVE WATCHDOG (MISCELLANEOUS) ×2 IMPLANT
PACK CARDIAC CATHETERIZATION (CUSTOM PROCEDURE TRAY) ×8 IMPLANT
STENT SYNERGY XD 3.0X20 (Permanent Stent) IMPLANT
SYNERGY XD 3.0X20 (Permanent Stent) ×3 IMPLANT
TRANSDUCER W/STOPCOCK (MISCELLANEOUS) ×8 IMPLANT
TUBING CIL FLEX 10 FLL-RA (TUBING) ×8 IMPLANT
WIRE COUGAR XT STRL 190CM (WIRE) ×2 IMPLANT

## 2021-09-03 NOTE — Progress Notes (Signed)
1555 PM, Pt had c/o of chest pain, 6/10 on pain scale. NTG given x 3, BP had remained stable . EKG was completed indicating Acute MI/STEMI. Charge RN called for assistance, RRT called for assistance, defib pads placed. Cardiac cath dept called to update as pt is scheduled for cardiac cath.Harriett Rn from Cath lab had instructed [place] to call Code STEMI. Pt sent to Cath lab for further management of Chest pain.

## 2021-09-03 NOTE — Progress Notes (Signed)
Patient complained of dysuria. One incident of hematuria after voiding . Physician was notified and rocephin was started.

## 2021-09-03 NOTE — Interval H&P Note (Signed)
Cath Lab Visit (complete for each Cath Lab visit)  Clinical Evaluation Leading to the Procedure:   ACS: Yes.    Non-ACS:    Anginal Classification: CCS IV  Anti-ischemic medical therapy: Minimal Therapy (1 class of medications)  Non-Invasive Test Results: No non-invasive testing performed  Prior CABG: No previous CABG      History and Physical Interval Note:  09/03/2021 4:42 PM  Lance Shepherd  has presented today for surgery, with the diagnosis of chest pain.  The various methods of treatment have been discussed with the patient and family. After consideration of risks, benefits and other options for treatment, the patient has consented to  Procedure(s): LEFT HEART CATH AND CORONARY ANGIOGRAPHY (N/A) as a surgical intervention.  The patient's history has been reviewed, patient examined, no change in status, stable for surgery.  I have reviewed the patient's chart and labs.  Questions were answered to the patient's satisfaction.     Tonny Bollman

## 2021-09-03 NOTE — H&P (View-Only) (Signed)
Progress Note  Patient Name: Lance Shepherd Date of Encounter: 09/03/2021  [facility] Cardiologist: Gypsy Balsam, MD   Subjective   Feeling well. No chest pain, sob or palpitations.    Inpatient Medications    Scheduled Meds:  aspirin EC  81 mg Oral Daily   fluticasone  1 spray Each Nare Daily   insulin aspart  0-20 Units Subcutaneous TID WC   insulin aspart  0-5 Units Subcutaneous QHS   insulin detemir  10 Units Subcutaneous QHS   [START ON 09/04/2021] losartan  25 mg Oral Daily   metoprolol tartrate  12.5 mg Oral BID   molnupiravir EUA  4 capsule Oral BID   mometasone-formoterol  2 puff Inhalation BID   sodium chloride flush  3 mL Intravenous Q12H   tamsulosin  0.4 mg Oral Daily   Continuous Infusions:  sodium chloride     sodium chloride 1 mL/kg/hr (09/03/21 0504)   cefTRIAXone (ROCEPHIN)  IV 1 g (09/03/21 0639)   heparin 1,400 Units/hr (09/03/21 0719)   PRN Meds: sodium chloride, acetaminophen, nitroGLYCERIN, ondansetron (ZOFRAN) IV, sodium chloride flush   Vital Signs    Vitals:   09/02/21 2026 09/02/21 2122 09/02/21 2200 09/03/21 0439  BP: 137/87 123/78 123/75 115/82  Pulse: 100 (!) 105 92 94  Resp: (!) 21  20 (!) 22  Temp: (!) 97.4 F (36.3 C)  (!) 97.4 F (36.3 C)   TempSrc: Axillary     SpO2: 91%  91% 90%  Weight:      Height:        Intake/Output Summary (Last 24 hours) at 09/03/2021 0821 Last data filed at 09/03/2021 0601 Gross per 24 hour  Intake 893.17 ml  Output 1000 ml  Net -106.83 ml   Last 3 Weights 09/01/2021 09/01/2021 08/23/2021  Weight (lbs) 250 lb 14.1 oz 253 lb 8.5 oz 254 lb 6.4 oz  Weight (kg) 113.8 kg 115 kg 115.395 kg      Telemetry    NSR, PVC - Personally Reviewed  ECG    N/A  Physical Exam   Per MD  Labs    High Sensitivity Troponin:   Recent Labs  Lab 09/01/21 1653 09/01/21 1830 09/01/21 2212 09/02/21 0046  TROPONINIHS 11,378* 13,568* 22,846* 22,153*     Chemistry Recent Labs  Lab  09/01/21 1653 09/03/21 0336  NA 132* 135  K 4.5 4.8  CL 97* 100  CO2 25 25  GLUCOSE 255* 212*  BUN 12 17  CREATININE 1.21 1.29*  CALCIUM 8.9 8.7*  PROT  --  6.2*  ALBUMIN  --  2.9*  AST  --  44*  ALT  --  31  ALKPHOS  --  55  BILITOT  --  1.1  GFRNONAA >60 57*  ANIONGAP 10 10    Lipids No results for input(s): CHOL, TRIG, HDL, LABVLDL, LDLCALC, CHOLHDL in the last 168 hours.  Hematology Recent Labs  Lab 09/01/21 1653 09/02/21 0046 09/03/21 0336  WBC 7.4 8.3 7.2  RBC 5.42 5.12 5.10  HGB 16.0 14.9 14.9  HCT 48.8 45.8 45.8  MCV 90.0 89.5 89.8  MCH 29.5 29.1 29.2  MCHC 32.8 32.5 32.5  RDW 13.9 13.7 14.0  PLT 173 153 171   Thyroid No results for input(s): TSH, FREET4 in the last 168 hours.  BNP Recent Labs  Lab 09/02/21 1803  BNP 140.0*    Radiology    DG Chest Port 1 View  Result Date: 09/01/2021 CLINICAL DATA:  Chest pain  and nausea/vomiting since yesterday, hemodynamically stable, symptoms responded to nitroglycerin yesterday but not today EXAM: PORTABLE CHEST 1 VIEW COMPARISON:  Portable exam 1639 hours compared to 12/18/2019 FINDINGS: Minimal enlargement of cardiac silhouette with pulmonary vascular congestion. Mediastinal contours normal. Lordotic positioning. Chronic accentuation of RIGHT perihilar markings. No definite acute infiltrate, pleural effusion, or pneumothorax. Bones demineralized. IMPRESSION: Enlargement of cardiac silhouette with pulmonary vascular congestion. No definite acute abnormalities. Electronically Signed   By: Ulyses Southward M.D.   On: 09/01/2021 16:52    Cardiac Studies   Pending echo and cath today   Patient Profile     75 y.o. male  with a hx of coronary artery disease status post prior PCI of the RCA and LCx in 2016 and subsequent non-STEMI in 2019 with DES to the OM and angioplasty to the distal OM, diabetes mellitus II, hyperlipidemia (intolerant to statins), hypertension, venous insufficiency, BPH, COPD, sleep apnea and recent  COVID who is being seen 09/01/2021 for the evaluation of chest pain.   Assessment & Plan    NSTEMI - Troponin > 38177. Treated with heparin - Pending echo and cath later today.  - Hx of intolerance to Brilinta - Continue ASA and BB  2. HLD - 01/24/2021: Cholesterol, Total 172; HDL 38; LDL Chol Calc (NIH) 108; Triglycerides 149  - Refer to lipid clinic per Dr. Vanetta Shawl note 08/23/21 - Statin intolerance  3. AKI - Scr 1.29 today  - Hold Losartan for cath   4. COVID 19 - on Molnupiravir PTA - Per primary team   5. DM - Uncontrolled, A1c 11.6 - Per primary team   6. HTN - Continue BB - Hold losartan today   For questions or updates, please contact [facility] Please consult [email or web address] for contact info under        Signed, Manson Passey, PA  09/03/2021, 8:21 AM     ATTENDING ATTESTATION  I have seen, examined and evaluated the patient this PM following- along with Mr. Iver Nestle, [place].  After reviewing all the available data and chart, we discussed the patients laboratory, study & physical findings as well as symptoms in detail. I agree with his findings, examination as well as impression recommendations as per our discussion.    Only notes ~ GERD Sx with COVID medications.  NO more CP.  Coarse lung sounds - but no SOB .  BNP ~140 -& euvolemic.    Agree with Cath today Prelim look @ Echo with EF 40-45% - difficult to determine WMA     Bryan Lemma, M.D., M.S. Interventional Cardiologist   Pager # [phone] Phone # [phone] [address] [place], [place] [address]

## 2021-09-03 NOTE — Progress Notes (Addendum)
PROGRESS NOTE  Lance Shepherd  FIE:[id number] DOB: [birth year] DOA: 09/01/2021 PCP: [name], MD   Brief Narrative: Lance Shepherd is a 75 y.o. male with a history of CAD, IDT2DM, HTN, HLD, statin intolerance, obesity, recent diagnosis of symptomatic covid-19 infection 12/22, started on molnupiravir who presented to the ED 12/25 with chest pain resolved with NTG found to have elevated and rising cardiac enzymes consistent with NSTEMI. IV heparin was given and patient admitted with plans for cardiac catheterization 09/03/2021.  Assessment & Plan: Principal Problem:   NSTEMI (non-ST elevated myocardial infarction) [facility]) Active Problems:   Diabetes mellitus (HCC)   Coronary artery disease stent to RCA and circumflex in November 2017, stent to obtuse marginal branch in 2019   Chronic obstructive pulmonary disease (HCC)   Diabetes mellitus type 2 in obese (HCC)   Hyperlipidemia   Statin intolerance   COVID-19 virus infection  Covid-19 infection: Without evidence of pneumonia, though this is symptomatic all the same. At high risk of progression to pneumonia/severe disease. CRP 6.8.  - Continue molnupiravir started PTA to complete 5 day course (pt reports having taken 5 total BID doses).  - Continue isolation x10 days. Positive test was initially 11/22.   NSTEMI in pt w/CAD s/p PCI RCA, LCx Nov 2016 and DES to mid-OM, and angioplasty to distal OM April 2019, HLD: Troponin has peaked at 22k, and chest pain subsided.  - Continue heparin IV pending cardiac catheterization later today. Post-cath care per cardiology. - ASA, BB, prn NTG - Refer to lipid clinic due to statin intolerance. Last LDL 108.  UTI: Developed dysuria 12/26, +UA. No leukocytosis or fever - Ceftriaxone started, urine culture pending. Symptoms improved.   HTN: Continue ARB.   IDT2DM: Uncontrolled with HbA1c 11.6%.  - Converted 70/30 to basal-bolus while admitted. Levemir 10u qHS, resistant SSI, HS coverage. At inpatient  goal currently  RBBB, PVCs:  - Optimize electrolytes, monitoring.  Pulmonary vascular congestion, cardiomegaly:  - Echocardiogram still pending.    Obesity: Estimated body mass index is 36 kg/m as calculated from the following:   Height as of this encounter: 5\' 10"  (1.778 m).   Weight as of this encounter: 113.8 kg.  DVT prophylaxis: Heparin IV Code Status: Full Family Communication: Daughter at bedside Disposition Plan:  Status is: Inpatient  Remains inpatient appropriate because: Needs cath, echo, possible diuresis, continuous heparin  Consultants:  Cardiology  Procedures:  Cardiac catheterization planned 09/03/2021  Antimicrobials: Mulnupiravir   Subjective: Developed dysuria last night that has since improved. No abd pain. No fever/chills. No chest pain any longer, no dyspnea. +cough.   Objective: Vitals:   09/03/21 0439 09/03/21 1008 09/03/21 1122 09/03/21 1344  BP: 115/82 126/88 126/88 121/75  Pulse: 94 93 92 94  Resp: (!) 22  20 19   Temp:    98 F (36.7 C)  TempSrc:    Oral  SpO2: 90%  95% 94%  Weight:      Height:        Intake/Output Summary (Last 24 hours) at 09/03/2021 1429 Last data filed at 09/03/2021 1123 Gross per 24 hour  Intake 893.17 ml  Output 1300 ml  Net -406.83 ml   Filed Weights   09/01/21 1636 09/01/21 2300  Weight: 115 kg 113.8 kg   Gen: 75 y.o. male in no distress Pulm: Nonlabored breathing still on 2L O2. Clear. CV: Regular rate and rhythm. Distant without definite murmur, rub, or gallop. No JVD sitting upright, trace edema. GI: Abdomen soft, non-tender,  non-distended, with normoactive bowel sounds.  Ext: Warm, no deformities Skin: No rashes, lesions or ulcers on visualized skin. Neuro: Alert and oriented. No focal neurological deficits. Psych: Judgement and insight appear fair. Mood euthymic & affect congruent. Behavior is appropriate.    Data Reviewed: I have personally reviewed following labs and imaging  studies  CBC: Recent Labs  Lab 09/01/21 1653 09/02/21 0046 09/03/21 0336  WBC 7.4 8.3 7.2  NEUTROABS 4.4  --   --   HGB 16.0 14.9 14.9  HCT 48.8 45.8 45.8  MCV 90.0 89.5 89.8  PLT 173 153 171   Basic Metabolic Panel: Recent Labs  Lab 09/01/21 1653 09/03/21 0336  NA 132* 135  K 4.5 4.8  CL 97* 100  CO2 25 25  GLUCOSE 255* 212*  BUN 12 17  CREATININE 1.21 1.29*  CALCIUM 8.9 8.7*   GFR: Estimated Creatinine Clearance: 61.5 mL/min (A) (by C-G formula based on SCr of 1.29 mg/dL (H)). Liver Function Tests: Recent Labs  Lab 09/03/21 0336  AST 44*  ALT 31  ALKPHOS 55  BILITOT 1.1  PROT 6.2*  ALBUMIN 2.9*   No results for input(s): LIPASE, AMYLASE in the last 168 hours. No results for input(s): AMMONIA in the last 168 hours. Coagulation Profile: No results for input(s): INR, PROTIME in the last 168 hours. Cardiac Enzymes: No results for input(s): CKTOTAL, CKMB, CKMBINDEX, TROPONINI in the last 168 hours. BNP (last 3 results) No results for input(s): PROBNP in the last 8760 hours. HbA1C: Recent Labs    09/01/21 2112  HGBA1C 11.6*   CBG: Recent Labs  Lab 09/02/21 1248 09/02/21 1659 09/02/21 2130 09/03/21 0748 09/03/21 1120  GLUCAP 148* 205* 221* 194* 170*   Lipid Profile: No results for input(s): CHOL, HDL, LDLCALC, TRIG, CHOLHDL, LDLDIRECT in the last 72 hours. Thyroid Function Tests: No results for input(s): TSH, T4TOTAL, FREET4, T3FREE, THYROIDAB in the last 72 hours. Anemia Panel: No results for input(s): VITAMINB12, FOLATE, FERRITIN, TIBC, IRON, RETICCTPCT in the last 72 hours. Urine analysis:    Component Value Date/Time   COLORURINE YELLOW 09/03/2021 0426   APPEARANCEUR CLOUDY (A) 09/03/2021 0426   LABSPEC 1.010 09/03/2021 0426   PHURINE 7.5 09/03/2021 0426   GLUCOSEU NEGATIVE 09/03/2021 0426   HGBUR LARGE (A) 09/03/2021 0426   BILIRUBINUR NEGATIVE 09/03/2021 0426   KETONESUR NEGATIVE 09/03/2021 0426   PROTEINUR NEGATIVE 09/03/2021 0426    NITRITE POSITIVE (A) 09/03/2021 0426   LEUKOCYTESUR SMALL (A) 09/03/2021 0426   Recent Results (from the past 240 hour(s))  Resp Panel by RT-PCR (Flu A&B, Covid) Nasopharyngeal Swab     Status: Abnormal   Collection Time: 09/01/21  6:55 PM   Specimen: Nasopharyngeal Swab; Nasopharyngeal(NP) swabs in vial transport medium  Result Value Ref Range Status   SARS Coronavirus 2 by RT PCR POSITIVE (A) NEGATIVE Final    Comment: (NOTE) SARS-CoV-2 target nucleic acids are DETECTED.  The SARS-CoV-2 RNA is generally detectable in upper respiratory specimens during the acute phase of infection. Positive results are indicative of the presence of the identified virus, but do not rule out bacterial infection or co-infection with other pathogens not detected by the test. Clinical correlation with patient history and other diagnostic information is necessary to determine patient infection status. The expected result is Negative.  Fact Sheet for Patients: BloggerCourse.com  Fact Sheet for Healthcare Providers: SeriousBroker.it  This test is not yet approved or cleared by the [place] FDA and  has been authorized for detection and/or diagnosis  of SARS-CoV-2 by FDA under an Emergency Use Authorization (EUA).  This EUA will remain in effect (meaning this test can be used) for the duration of  the COVID-19 declaration under Section 564(b)(1) of the A ct, 21 U.S.C. section 360bbb-3(b)(1), unless the authorization is terminated or revoked sooner.     Influenza A by PCR NEGATIVE NEGATIVE Final   Influenza B by PCR NEGATIVE NEGATIVE Final    Comment: (NOTE) The Xpert Xpress SARS-CoV-2/FLU/RSV plus assay is intended as an aid in the diagnosis of influenza from Nasopharyngeal swab specimens and should not be used as a sole basis for treatment. Nasal washings and aspirates are unacceptable for Xpert Xpress SARS-CoV-2/FLU/RSV testing.  Fact Sheet  for Patients: BloggerCourse.com  Fact Sheet for Healthcare Providers: SeriousBroker.it  This test is not yet approved or cleared by the [place] FDA and has been authorized for detection and/or diagnosis of SARS-CoV-2 by FDA under an Emergency Use Authorization (EUA). This EUA will remain in effect (meaning this test can be used) for the duration of the COVID-19 declaration under Section 564(b)(1) of the Act, 21 U.S.C. section 360bbb-3(b)(1), unless the authorization is terminated or revoked.  Performed at [facility], [address] [place], [place] [address]       Radiology Studies: DG Chest Port 1 View  Result Date: 09/01/2021 CLINICAL DATA:  Chest pain and nausea/vomiting since yesterday, hemodynamically stable, symptoms responded to nitroglycerin yesterday but not today EXAM: PORTABLE CHEST 1 VIEW COMPARISON:  Portable exam 1639 hours compared to 12/18/2019 FINDINGS: Minimal enlargement of cardiac silhouette with pulmonary vascular congestion. Mediastinal contours normal. Lordotic positioning. Chronic accentuation of RIGHT perihilar markings. No definite acute infiltrate, pleural effusion, or pneumothorax. Bones demineralized. IMPRESSION: Enlargement of cardiac silhouette with pulmonary vascular congestion. No definite acute abnormalities. Electronically Signed   By: Ulyses Southward M.D.   On: 09/01/2021 16:52    Scheduled Meds:  aspirin EC  81 mg Oral Daily   fluticasone  1 spray Each Nare Daily   insulin aspart  0-20 Units Subcutaneous TID WC   insulin aspart  0-5 Units Subcutaneous QHS   insulin detemir  10 Units Subcutaneous QHS   [START ON 09/04/2021] losartan  25 mg Oral Daily   metoprolol tartrate  12.5 mg Oral BID   molnupiravir EUA  4 capsule Oral BID   mometasone-formoterol  2 puff Inhalation BID   sodium chloride flush  3 mL Intravenous Q12H   tamsulosin  0.4 mg Oral Daily   Continuous Infusions:  sodium  chloride     sodium chloride 1 mL/kg/hr (09/03/21 0504)   cefTRIAXone (ROCEPHIN)  IV 1 g (09/03/21 0639)   heparin 1,400 Units/hr (09/03/21 0719)     LOS: 2 days   Time spent: 35 minutes.  Tyrone Nine, MD [facility] [email or web address] 09/03/2021, 2:29 PM

## 2021-09-03 NOTE — Progress Notes (Signed)
ANTICOAGULATION CONSULT NOTE  Pharmacy Consult for heparin Indication: chest pain/ACS  Allergies  Allergen Reactions   Ticagrelor Shortness Of Breath   Pioglitazone Diarrhea   Statins Nausea Only and Rash    Patient Measurements: Height: 5\' 10"  (177.8 cm) Weight: 113.8 kg (250 lb 14.1 oz) IBW/kg (Calculated) : 73 Heparin Dosing Weight: 98kg  Vital Signs: Temp: 97.4 F (36.3 C) (12/26 2200) Temp Source: Axillary (12/26 2026) BP: 115/82 (12/27 0439) Pulse Rate: 94 (12/27 0439)  Labs: Recent Labs    09/01/21 1653 09/01/21 1830 09/01/21 2212 09/02/21 0046 09/02/21 0842 09/03/21 0336  HGB 16.0  --   --  14.9  --  14.9  HCT 48.8  --   --  45.8  --  45.8  PLT 173  --   --  153  --  171  HEPARINUNFRC  --   --   --  0.27* 0.42 0.39  CREATININE 1.21  --   --   --   --  1.29*  TROPONINIHS 11,378* 13,568* 22,846* 22,153*  --   --      Estimated Creatinine Clearance: 61.5 mL/min (A) (by C-G formula based on SCr of 1.29 mg/dL (H)).   Medical History: Past Medical History:  Diagnosis Date   Acute neck pain 12/21/2019   Allergic rhinitis 11/16/2018   Behavior disturbance 04/10/2020   Benign prostatic hyperplasia without lower urinary tract symptoms 10/09/2015   Cervical discitis 01/01/2020   Chronic obstructive pulmonary disease (HCC) 10/09/2015   Chronic pain of left knee 06/16/2017   Constipation, chronic 04/10/2020   Coronary arteriosclerosis in native artery 08/07/2016   Coronary artery disease stent to RCA and circumflex in November 2017, stent to obtuse marginal branch in 2019 08/07/2016   Diabetes mellitus (HCC) 01/19/2020   Diabetes mellitus type 2 in obese (HCC) 09/13/2019   HTN (hypertension) 01/19/2020   Hyperlipidemia 10/09/2015   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 C5-6   Primary osteoarthritis of right knee 06/23/2017   Sinus tachycardia by electrocardiogram 11/11/2019   Spondylosis of cervical region without myelopathy or radiculopathy 12/21/2019   Statin intolerance 09/13/2019   Statin myopathy 08/20/2020   Status post coronary artery stent placement 01/19/2018    Assessment: 75 YOM presenting with CP and elevated troponins. Pt started on IV heparin, no AC PTA. Heparin level is therapeutic at 0.39, CBC stable.  Plans for cath today  Goal of Therapy:  Heparin level 0.3-0.7 units/ml Monitor platelets by anticoagulation protocol: Yes   Plan:  Heparin 1400 units/h Will follow plans after cath  01/21/2018, PharmD Clinical Pharmacist **Pharmacist phone directory can now be found on [email or web address] ([email or web address]).  Listed under [facility].

## 2021-09-03 NOTE — Progress Notes (Signed)
Patient returned from cath lab at 1804hrs. TR band to right wrist, level zero. Patient given post cath instructions.

## 2021-09-03 NOTE — Progress Notes (Addendum)
Progress Note  Patient Name: Lance Shepherd Date of Encounter: 09/03/2021  [facility] Cardiologist: Gypsy Balsam, MD   Subjective   Feeling well. No chest pain, sob or palpitations.    Inpatient Medications    Scheduled Meds:  aspirin EC  81 mg Oral Daily   fluticasone  1 spray Each Nare Daily   insulin aspart  0-20 Units Subcutaneous TID WC   insulin aspart  0-5 Units Subcutaneous QHS   insulin detemir  10 Units Subcutaneous QHS   [START ON 09/04/2021] losartan  25 mg Oral Daily   metoprolol tartrate  12.5 mg Oral BID   molnupiravir EUA  4 capsule Oral BID   mometasone-formoterol  2 puff Inhalation BID   sodium chloride flush  3 mL Intravenous Q12H   tamsulosin  0.4 mg Oral Daily   Continuous Infusions:  sodium chloride     sodium chloride 1 mL/kg/hr (09/03/21 0504)   cefTRIAXone (ROCEPHIN)  IV 1 g (09/03/21 0639)   heparin 1,400 Units/hr (09/03/21 0719)   PRN Meds: sodium chloride, acetaminophen, nitroGLYCERIN, ondansetron (ZOFRAN) IV, sodium chloride flush   Vital Signs    Vitals:   09/02/21 2026 09/02/21 2122 09/02/21 2200 09/03/21 0439  BP: 137/87 123/78 123/75 115/82  Pulse: 100 (!) 105 92 94  Resp: (!) 21  20 (!) 22  Temp: (!) 97.4 F (36.3 C)  (!) 97.4 F (36.3 C)   TempSrc: Axillary     SpO2: 91%  91% 90%  Weight:      Height:        Intake/Output Summary (Last 24 hours) at 09/03/2021 0821 Last data filed at 09/03/2021 0601 Gross per 24 hour  Intake 893.17 ml  Output 1000 ml  Net -106.83 ml   Last 3 Weights 09/01/2021 09/01/2021 08/23/2021  Weight (lbs) 250 lb 14.1 oz 253 lb 8.5 oz 254 lb 6.4 oz  Weight (kg) 113.8 kg 115 kg 115.395 kg      Telemetry    NSR, PVC - Personally Reviewed  ECG    N/A  Physical Exam   Per MD  Labs    High Sensitivity Troponin:   Recent Labs  Lab 09/01/21 1653 09/01/21 1830 09/01/21 2212 09/02/21 0046  TROPONINIHS 11,378* 13,568* 22,846* 22,153*     Chemistry Recent Labs  Lab  09/01/21 1653 09/03/21 0336  NA 132* 135  K 4.5 4.8  CL 97* 100  CO2 25 25  GLUCOSE 255* 212*  BUN 12 17  CREATININE 1.21 1.29*  CALCIUM 8.9 8.7*  PROT  --  6.2*  ALBUMIN  --  2.9*  AST  --  44*  ALT  --  31  ALKPHOS  --  55  BILITOT  --  1.1  GFRNONAA >60 57*  ANIONGAP 10 10    Lipids No results for input(s): CHOL, TRIG, HDL, LABVLDL, LDLCALC, CHOLHDL in the last 168 hours.  Hematology Recent Labs  Lab 09/01/21 1653 09/02/21 0046 09/03/21 0336  WBC 7.4 8.3 7.2  RBC 5.42 5.12 5.10  HGB 16.0 14.9 14.9  HCT 48.8 45.8 45.8  MCV 90.0 89.5 89.8  MCH 29.5 29.1 29.2  MCHC 32.8 32.5 32.5  RDW 13.9 13.7 14.0  PLT 173 153 171   Thyroid No results for input(s): TSH, FREET4 in the last 168 hours.  BNP Recent Labs  Lab 09/02/21 1803  BNP 140.0*    Radiology    DG Chest Port 1 View  Result Date: 09/01/2021 CLINICAL DATA:  Chest pain  and nausea/vomiting since yesterday, hemodynamically stable, symptoms responded to nitroglycerin yesterday but not today EXAM: PORTABLE CHEST 1 VIEW COMPARISON:  Portable exam 1639 hours compared to 12/18/2019 FINDINGS: Minimal enlargement of cardiac silhouette with pulmonary vascular congestion. Mediastinal contours normal. Lordotic positioning. Chronic accentuation of RIGHT perihilar markings. No definite acute infiltrate, pleural effusion, or pneumothorax. Bones demineralized. IMPRESSION: Enlargement of cardiac silhouette with pulmonary vascular congestion. No definite acute abnormalities. Electronically Signed   By: Ulyses Southward M.D.   On: 09/01/2021 16:52    Cardiac Studies   Pending echo and cath today   Patient Profile     75 y.o. male  with a hx of coronary artery disease status post prior PCI of the RCA and LCx in 2016 and subsequent non-STEMI in 2019 with DES to the OM and angioplasty to the distal OM, diabetes mellitus II, hyperlipidemia (intolerant to statins), hypertension, venous insufficiency, BPH, COPD, sleep apnea and recent  COVID who is being seen 09/01/2021 for the evaluation of chest pain.   Assessment & Plan    NSTEMI - Troponin > 94076. Treated with heparin - Pending echo and cath later today.  - Hx of intolerance to Brilinta - Continue ASA and BB  2. HLD - 01/24/2021: Cholesterol, Total 172; HDL 38; LDL Chol Calc (NIH) 108; Triglycerides 149  - Refer to lipid clinic per Dr. Vanetta Shawl note 08/23/21 - Statin intolerance  3. AKI - Scr 1.29 today  - Hold Losartan for cath   4. COVID 19 - on Molnupiravir PTA - Per primary team   5. DM - Uncontrolled, A1c 11.6 - Per primary team   6. HTN - Continue BB - Hold losartan today   For questions or updates, please contact [facility] Please consult [email or web address] for contact info under        Signed, Manson Passey, PA  09/03/2021, 8:21 AM     ATTENDING ATTESTATION  I have seen, examined and evaluated the patient this PM following- along with Mr. Iver Nestle, [place].  After reviewing all the available data and chart, we discussed the patients laboratory, study & physical findings as well as symptoms in detail. I agree with his findings, examination as well as impression recommendations as per our discussion.    Only notes ~ GERD Sx with COVID medications.  NO more CP.  Coarse lung sounds - but no SOB .  BNP ~140 -& euvolemic.    Agree with Cath today Prelim look @ Echo with EF 40-45% - difficult to determine WMA     Bryan Lemma, M.D., M.S. Interventional Cardiologist   Pager # [phone] Phone # [phone] [address] [place], [place] [address]

## 2021-09-03 NOTE — Progress Notes (Signed)
This chaplain responded to Code Stemi on the unit. The chaplain learned from the RN, no needs at this time.  The chaplain understands the Code was later cancelled.  This chaplain is available for F/U spiritual care as needed.  Chaplain Stephanie Acre [phone]

## 2021-09-03 NOTE — TOC Benefit Eligibility Note (Signed)
Patient Product/process development scientist completed.    The patient is currently admitted and upon discharge could be taking prasugrel (Effient) 10 mg.  The current 30 day co-pay is, $10.52  The patient is currently admitted and upon discharge could be taking Jardiance 10 mg.  The current 30 day co-pay is, $152.57 due to patient being in Coverage Gap (donut hole).   The patient is currently admitted and upon discharge could be taking Farxiga  10 mg.  The current 30 day co-pay is, $146.78 due to patient being in Coverage Gap (donut hole).   The patient is insured through [employer]     Roland Earl, CPhT Pharmacy Patient Advocate Specialist [facility] Patient Advocate Team Direct Number: [phone]  Fax: [phone]

## 2021-09-04 ENCOUNTER — Encounter (HOSPITAL_COMMUNITY): Payer: Self-pay | Admitting: Cardiovascular Disease

## 2021-09-04 DIAGNOSIS — J441 Chronic obstructive pulmonary disease with (acute) exacerbation: Secondary | ICD-10-CM | POA: Diagnosis not present

## 2021-09-04 DIAGNOSIS — U071 COVID-19: Secondary | ICD-10-CM | POA: Diagnosis not present

## 2021-09-04 DIAGNOSIS — Z955 Presence of coronary angioplasty implant and graft: Secondary | ICD-10-CM

## 2021-09-04 DIAGNOSIS — E1169 Type 2 diabetes mellitus with other specified complication: Secondary | ICD-10-CM | POA: Diagnosis not present

## 2021-09-04 DIAGNOSIS — E785 Hyperlipidemia, unspecified: Secondary | ICD-10-CM

## 2021-09-04 DIAGNOSIS — I214 Non-ST elevation (NSTEMI) myocardial infarction: Secondary | ICD-10-CM | POA: Diagnosis not present

## 2021-09-04 LAB — CBC
HCT: 43.5 % (ref 39.0–52.0)
Hemoglobin: 13.7 g/dL (ref 13.0–17.0)
MCH: 28.8 pg (ref 26.0–34.0)
MCHC: 31.5 g/dL (ref 30.0–36.0)
MCV: 91.6 fL (ref 80.0–100.0)
Platelets: 169 10*3/uL (ref 150–400)
RBC: 4.75 MIL/uL (ref 4.22–5.81)
RDW: 14.3 % (ref 11.5–15.5)
WBC: 6.4 10*3/uL (ref 4.0–10.5)
nRBC: 0 % (ref 0.0–0.2)

## 2021-09-04 LAB — BASIC METABOLIC PANEL
Anion gap: 11 (ref 5–15)
BUN: 15 mg/dL (ref 8–23)
CO2: 21 mmol/L — ABNORMAL LOW (ref 22–32)
Calcium: 8.2 mg/dL — ABNORMAL LOW (ref 8.9–10.3)
Chloride: 103 mmol/L (ref 98–111)
Creatinine, Ser: 1.26 mg/dL — ABNORMAL HIGH (ref 0.61–1.24)
GFR, Estimated: 59 mL/min — ABNORMAL LOW (ref >60)
Glucose, Bld: 217 mg/dL — ABNORMAL HIGH (ref 70–99)
Potassium: 4.6 mmol/L (ref 3.5–5.1)
Sodium: 135 mmol/L (ref 135–145)

## 2021-09-04 LAB — GLUCOSE, CAPILLARY
Glucose-Capillary: 189 mg/dL — ABNORMAL HIGH (ref 70–99)
Glucose-Capillary: 191 mg/dL — ABNORMAL HIGH (ref 70–99)
Glucose-Capillary: 265 mg/dL — ABNORMAL HIGH (ref 70–99)
Glucose-Capillary: 205 mg/dL — ABNORMAL HIGH (ref 70–99)
Glucose-Capillary: 142 mg/dL — ABNORMAL HIGH (ref 70–99)

## 2021-09-04 MED ORDER — INSULIN DETEMIR 100 UNIT/ML ~~LOC~~ SOLN
9.0000 [IU] | Freq: Two times a day (BID) | SUBCUTANEOUS | Status: DC
  Administered 2021-09-04 – 2021-09-05 (×2): 9 [IU] via SUBCUTANEOUS
  Filled 2021-09-04 (×3): qty 0.09

## 2021-09-04 MED ORDER — INSULIN DETEMIR 100 UNIT/ML ~~LOC~~ SOLN: 10.0000 [IU] | Freq: Two times a day (BID) | SUBCUTANEOUS | Status: DC

## 2021-09-04 MED ORDER — METOPROLOL TARTRATE 25 MG PO TABS
25.0000 mg | ORAL_TABLET | Freq: Two times a day (BID) | ORAL | Status: DC
  Administered 2021-09-04 – 2021-09-05 (×2): 25 mg via ORAL
  Filled 2021-09-04 (×2): qty 1

## 2021-09-04 MED FILL — Tirofiban HCl in NaCl 0.9% IV Soln 5 MG/100ML (Base Equiv): INTRAVENOUS | Qty: 100 | Status: AC

## 2021-09-04 NOTE — Evaluation (Signed)
Physical Therapy Evaluation & Discharge Patient Details Name: Lance Shepherd MRN: [id number] DOB: 10-Apr-1946 Today's Date: 09/04/2021  History of Present Illness  Pt is a 75 y.o. male who presented 09/01/21 with chest pain, which resolved with nitroglycerin. Pt admitted with NSTEMI. S/p cardiac cath 12/27. PMH: CAD with PCI to the RCA and circumflex in 2016 then again in April 2019, insulin-dependent diabetes mellitus type 2, essential hypertension, statin intolerance recently diagnosed with symptomatic COVID on 08/29/2021   Clinical Impression  Pt presents with condition above. PTA, he was independent without an AD for mobility, living alone in a 1-level house with 5 STE. Currently, he appears to be at his baseline, displaying WFL strength, balance, and gait pattern, not needing any physical assistance or UE support for mobility. Educated pt and his daughter on mobilizing while in hospital and how to manage lines to allow him to mobilize in room mod I. Notified RN pt is safe to mobilize independently in room also. All education completed and questions answered. PT will sign off.       Recommendations for follow up therapy are one component of a multi-disciplinary discharge planning process, led by the attending physician.  Recommendations may be updated based on patient status, additional functional criteria and insurance authorization.  Follow Up Recommendations No PT follow up    Assistance Recommended at Discharge None  Functional Status Assessment Patient has not had a recent decline in their functional status  Equipment Recommendations  None recommended by PT    Recommendations for Other Services       Precautions / Restrictions Precautions Precautions: Other (comment) Precaution Comments: monitor vitals Restrictions Weight Bearing Restrictions: No      Mobility  Bed Mobility               General bed mobility comments: Pt sitting up EOB upon arrival.     Transfers Overall transfer level: Independent Equipment used: None               General transfer comment: Pt able to come to stand from EOB safely without assistance or LOB.    Ambulation/Gait Ambulation/Gait assistance: Modified independent (Device/Increase time) Gait Distance (Feet): 250 Feet Assistive device: None Gait Pattern/deviations: WFL(Within Functional Limits) Gait velocity: slightly reduced Gait velocity interpretation: >2.62 ft/sec, indicative of community ambulatory   General Gait Details: Pt ambulated laps in room due to being on COVID precautions. No LOB, but did slow speed when cued to change head positions while ambulating. Pt able to change directions and speeds safely without LOB also.  Stairs            Wheelchair Mobility    Modified Rankin (Stroke Patients Only)       Balance Overall balance assessment: No apparent balance deficits (not formally assessed)                                           Pertinent Vitals/Pain Pain Assessment: No/denies pain    Home Living Family/patient expects to be discharged to:: Private residence Living Arrangements: Alone Available Help at Discharge: Family;Available PRN/intermittently Type of Home: House Home Access: Stairs to enter Entrance Stairs-Rails: Can reach both Entrance Stairs-Number of Steps: 5   Home Layout: One level Home Equipment: None      Prior Function Prior Level of Function : Independent/Modified Independent;Driving  Mobility Comments: Does not use an AD. Denies any recent falls.       Hand Dominance   Dominant Hand: Left    Extremity/Trunk Assessment   Upper Extremity Assessment Upper Extremity Assessment: Overall WFL for tasks assessed    Lower Extremity Assessment Lower Extremity Assessment: Overall WFL for tasks assessed    Cervical / Trunk Assessment Cervical / Trunk Assessment: Normal  Communication   Communication: No  difficulties  Cognition Arousal/Alertness: Awake/alert Behavior During Therapy: WFL for tasks assessed/performed Overall Cognitive Status: Within Functional Limits for tasks assessed                                          General Comments General comments (skin integrity, edema, etc.): HR 100-110s, SpO2 stable on RA, BP ~130-140s/70-80s end of session; educated pt and daughter on how to [facility] and re-hook monitor if pt needs to mobilize in room    Exercises     Assessment/Plan    PT Assessment Patient does not need any further PT services  PT Problem List         PT Treatment Interventions      PT Goals (Current goals can be found in the Care Plan section)  Acute Rehab PT Goals Patient Stated Goal: to go home tomorrow PT Goal Formulation: All assessment and education complete, DC therapy Time For Goal Achievement: 09/05/21 Potential to Achieve Goals: Good    Frequency     Barriers to discharge        Co-evaluation               AM-PAC PT "6 Clicks" Mobility  Outcome Measure Help needed turning from your back to your side while in a flat bed without using bedrails?: None Help needed moving from lying on your back to sitting on the side of a flat bed without using bedrails?: None Help needed moving to and from a bed to a chair (including a wheelchair)?: None Help needed standing up from a chair using your arms (e.g., wheelchair or bedside chair)?: None Help needed to walk in hospital room?: None Help needed climbing 3-5 steps with a railing? : A Little 6 Click Score: 23    End of Session   Activity Tolerance: Patient tolerated treatment well Patient left: in chair;with call bell/phone within reach;with family/visitor present Nurse Communication: Mobility status;Other (comment) (vitals) PT Visit Diagnosis: Other (comment) (cardiac deficits)    Time: 4128-7867 PT Time Calculation (min) (ACUTE ONLY): 14 min   Charges:   PT Evaluation $PT  Eval Low Complexity: 1 Low          Raymond Gurney, PT, DPT Acute Rehabilitation Services  Pager: [phone] Office: [phone]   Jewel Baize 09/04/2021, 3:19 PM

## 2021-09-04 NOTE — Progress Notes (Signed)
[facility] PROGRESS NOTE    Progress Note  Lance Shepherd  DDU:[id number] DOB: [birth year] DOA: 09/01/2021 PCP: [name], MD     Brief Narrative:   Lance Shepherd is an 75 y.o. male past medical history of CAD with PCI to the RCA and circumflex in 2016 then again in April 2019, insulin-dependent diabetes mellitus type 2, essential hypertension, statin intolerance recently diagnosed with symptomatic COVID on 08/29/2021 started on Molnupiravir giving to the ED on Christmas Day with chest pain resolved with nitroglycerin, found to have an NSTEMI started on IV heparin cardiac cath was done on 09/03/2021   Assessment/Plan:   NSTEMI (non-ST elevated myocardial infarction) [facility]) Cardiology was consulted he was started on aspirin beta-blocker and nitroglycerin as needed. They recommended a left heart cath and status post DES to the LAD they also recommended dual antiplatelet therapy with aspirin and Plavix for at least 12 months. Continue aspirin, Plavix and beta-blockers. Out of bed to chair, consult physical therapy  Hyperlipidemia: Due to his intolerance to statin will need to be referred to the lipid clinic.  Acute kidney injury: Losartan was held for left heart cath, renal function has remained stable. Recheck basic metabolic panel tomorrow morning make sure his creatinine is improving.   History of COVID-19 infection: Has remained relatively asymptomatic she completed her course of antibiotic and isolation.  Possible UTI: Urine cultures are pending continue Rocephin for 3 days.  Essential hypertension: Relatively well controlled holding ARB due to IV contrast.  Insulin-dependent diabetes mellitus type 2: Increase long-acting insulin continue sliding scale.  Morbid obesity: Noted.  DVT prophylaxis: lovenox Family Communication:none Status is: Inpatient  Remains inpatient appropriate because: NSTEMI renal function is deteriorating as well as blood glucose going up  we will monitor 1 more day.        Code Status:     Code Status Orders  (From admission, onward)           Start     Ordered   09/01/21 2201  Full code  Continuous        09/01/21 2201           Code Status History     This patient has a current code status but no historical code status.      Advance Directive Documentation    Flowsheet Row Most Recent Value  Type of Advance Directive Healthcare Power of Attorney  Pre-existing out of facility DNR order (yellow form or pink MOST form) --  "MOST" Form in Place? --         IV Access:   Peripheral IV   Procedures and diagnostic studies:   CARDIAC CATHETERIZATION  Result Date: 09/03/2021   Prox RCA lesion is 50% stenosed.   Dist RCA lesion is 70% stenosed.   1st Mrg lesion is 99% stenosed.   Prox LAD to Mid LAD lesion is 50% stenosed.   A drug-eluting stent was successfully placed using a SYNERGY XD 3.0X20.   Post intervention, there is a 0% residual stenosis. Subtotal occlusion of a large OM 1 branch, treated successfully with PCI using a 3.0x20 mm Synergy DES, improving the lesion from 99--->0%, TIMI-2---->TIMI-3 flow Moderate segmental RCA stenosis Moderate diffuse proximal LAD stenosis Recommend: DAPT with ASA and plavix minimum 12 months, aggressive risk reduction measures, medical therapy for residual CAD. If recurrent angina, could assess RCA and LAD for intervention as needed.   ECHOCARDIOGRAM COMPLETE  Result Date: 09/03/2021    ECHOCARDIOGRAM REPORT  Patient Name:   Lance Shepherd Date of Exam: 09/03/2021 Medical Rec #:  [id number]    Height:       70.0 in Accession #:    [id number]   Weight:       250.9 lb Date of Birth:  1946-08-22    BSA:          2.298 m Patient Age:    18 years     BP:           122/75 mmHg Patient Gender: M            HR:           92 bpm. Exam Location:  Inpatient Procedure: 2D Echo, Cardiac Doppler, Color Doppler and Intracardiac            Opacification Agent Indications:     Acute myocardial infarction, unspecified I21.9                 CHF-Acute Systolic I50.21  History:        Patient has prior history of Echocardiogram examinations, most                 recent 09/19/2020. Previous Myocardial Infarction and CAD, COPD;                 Risk Factors:Sleep Apnea, Dyslipidemia, Diabetes, Hypertension                 and Former Smoker. COVID. Venous insufficiency.  Sonographer:    Leta Jungling RDCS Referring Phys: [phone] [facility]  Sonographer Comments: Image acquisition challenging due to respiratory motion. PLAX measurements attempted. IMPRESSIONS  1. LV not well seen even with definity abnormal septal motion . Left ventricular ejection fraction, by estimation, is 50 to 55%. The left ventricle has low normal function. The left ventricle has no regional wall motion abnormalities. Left ventricular diastolic parameters were normal.  2. Right ventricular systolic function is normal. The right ventricular size is normal. There is normal pulmonary artery systolic pressure.  3. The mitral valve is normal in structure. No evidence of mitral valve regurgitation. No evidence of mitral stenosis.  4. The aortic valve was not well visualized. There is mild calcification of the aortic valve. Aortic valve regurgitation is not visualized. Aortic valve sclerosis is present, with no evidence of aortic valve stenosis.  5. The inferior vena cava is normal in size with greater than 50% respiratory variability, suggesting right atrial pressure of 3 mmHg. FINDINGS  Left Ventricle: LV not well seen even with definity abnormal septal motion. Left ventricular ejection fraction, by estimation, is 50 to 55%. The left ventricle has low normal function. The left ventricle has no regional wall motion abnormalities. Definity contrast agent was given IV to delineate the left ventricular endocardial borders. The left ventricular internal cavity size was normal in size. There is no left ventricular hypertrophy. Left  ventricular diastolic parameters were normal. Right Ventricle: The right ventricular size is normal. No increase in right ventricular wall thickness. Right ventricular systolic function is normal. There is normal pulmonary artery systolic pressure. The tricuspid regurgitant velocity is 1.93 m/s, and  with an assumed right atrial pressure of 15 mmHg, the estimated right ventricular systolic pressure is 29.9 mmHg. Left Atrium: Left atrial size was normal in size. Right Atrium: Right atrial size was normal in size. Pericardium: There is no evidence of pericardial effusion. Mitral Valve: The mitral valve is normal in structure. No evidence of mitral valve regurgitation.  No evidence of mitral valve stenosis. Tricuspid Valve: The tricuspid valve is normal in structure. Tricuspid valve regurgitation is not demonstrated. No evidence of tricuspid stenosis. Aortic Valve: The aortic valve was not well visualized. There is mild calcification of the aortic valve. Aortic valve regurgitation is not visualized. Aortic valve sclerosis is present, with no evidence of aortic valve stenosis. Pulmonic Valve: The pulmonic valve was normal in structure. Pulmonic valve regurgitation is not visualized. No evidence of pulmonic stenosis. Aorta: The aortic root is normal in size and structure. Venous: The inferior vena cava is normal in size with greater than 50% respiratory variability, suggesting right atrial pressure of 3 mmHg. IAS/Shunts: No atrial level shunt detected by color flow Doppler.  LEFT VENTRICLE PLAX 2D LVIDd:         4.60 cm      Diastology LVIDs:         3.20 cm      LV e' medial:    5.22 cm/s LV PW:         1.20 cm      LV E/e' medial:  12.9 LV IVS:        1.20 cm      LV e' lateral:   7.07 cm/s LVOT diam:     2.20 cm      LV E/e' lateral: 9.5 LV SV:         45 LV SV Index:   20 LVOT Area:     3.80 cm  LV Volumes (MOD) LV vol d, MOD A2C: 115.0 ml LV vol d, MOD A4C: 108.0 ml LV vol s, MOD A2C: 75.6 ml LV vol s, MOD A4C: 76.3  ml LV SV MOD A2C:     39.4 ml LV SV MOD A4C:     108.0 ml LV SV MOD BP:      35.0 ml RIGHT VENTRICLE TAPSE (M-mode): 1.4 cm LEFT ATRIUM             Index LA Vol (A2C):   37.4 ml 16.27 ml/m LA Vol (A4C):   52.3 ml 22.75 ml/m LA Biplane Vol: 47.4 ml 20.62 ml/m  AORTIC VALVE LVOT Vmax:   64.60 cm/s LVOT Vmean:  44.800 cm/s LVOT VTI:    0.118 m  AORTA Ao Root diam: 3.60 cm Ao Asc diam:  3.20 cm MITRAL VALVE               TRICUSPID VALVE MV Area (PHT): 2.39 cm    TR Peak grad:   14.9 mmHg MV Decel Time: 317 msec    TR Vmax:        193.00 cm/s MV E velocity: 67.30 cm/s MV A velocity: 94.70 cm/s  SHUNTS MV E/A ratio:  0.71        Systemic VTI:  0.12 m                            Systemic Diam: 2.20 cm Charlton Haws MD Electronically signed by Charlton Haws MD Signature Date/Time: 09/03/2021/3:07:03 PM    Final      Medical Consultants:   None.   Subjective:    Lance Shepherd denies any chest pain or shortness of breath.  Objective:    Vitals:   09/03/21 2030 09/04/21 0610 09/04/21 1019 09/04/21 1118  BP: 124/79 135/82 134/81 134/84  Pulse: 94 95 (!) 111 95  Resp: 18   18  Temp: 97.9 F (36.6 C) (!) 97.5 F (  36.4 C)  97.8 F (36.6 C)  TempSrc: Oral Oral  Oral  SpO2: 95% 93%    Weight:      Height:       SpO2: 93 % O2 Flow Rate (L/min): 4 L/min   Intake/Output Summary (Last 24 hours) at 09/04/2021 1221 Last data filed at 09/04/2021 5035 Gross per 24 hour  Intake 240 ml  Output 200 ml  Net 40 ml   Filed Weights   09/01/21 1636 09/01/21 2300  Weight: 115 kg 113.8 kg    Exam: General exam: In no acute distress. Respiratory system: Good air movement and clear to auscultation. Cardiovascular system: S1 & S2 heard, RRR. No JVD, murmurs, rubs, gallops or clicks.  Gastrointestinal system: Abdomen is nondistended, soft and nontender.  Central nervous system: Alert and oriented. No focal neurological deficits. Extremities: No pedal edema. Skin: No rashes, lesions or  ulcers Psychiatry: Judgement and insight appear normal. Mood & affect appropriate.    Data Reviewed:    Labs: Basic Metabolic Panel: Recent Labs  Lab 09/01/21 1653 09/03/21 0336 09/04/21 0446  NA 132* 135 135  K 4.5 4.8 4.6  CL 97* 100 103  CO2 25 25 21*  GLUCOSE 255* 212* 217*  BUN 12 17 15   CREATININE 1.21 1.29* 1.26*  CALCIUM 8.9 8.7* 8.2*   GFR Estimated Creatinine Clearance: 63 mL/min (A) (by C-G formula based on SCr of 1.26 mg/dL (H)). Liver Function Tests: Recent Labs  Lab 09/03/21 0336  AST 44*  ALT 31  ALKPHOS 55  BILITOT 1.1  PROT 6.2*  ALBUMIN 2.9*   No results for input(s): LIPASE, AMYLASE in the last 168 hours. No results for input(s): AMMONIA in the last 168 hours. Coagulation profile No results for input(s): INR, PROTIME in the last 168 hours. COVID-19 Labs  Recent Labs    09/02/21 0842  CRP 6.8*    Lab Results  Component Value Date   SARSCOV2NAA POSITIVE (A) 09/01/2021    CBC: Recent Labs  Lab 09/01/21 1653 09/02/21 0046 09/03/21 0336 09/04/21 0446  WBC 7.4 8.3 7.2 6.4  NEUTROABS 4.4  --   --   --   HGB 16.0 14.9 14.9 13.7  HCT 48.8 45.8 45.8 43.5  MCV 90.0 89.5 89.8 91.6  PLT 173 153 171 169   Cardiac Enzymes: No results for input(s): CKTOTAL, CKMB, CKMBINDEX, TROPONINI in the last 168 hours. BNP (last 3 results) No results for input(s): PROBNP in the last 8760 hours. CBG: Recent Labs  Lab 09/03/21 1120 09/03/21 1822 09/03/21 2141 09/04/21 0726 09/04/21 1117  GLUCAP 170* 132* 189* 191* 265*   D-Dimer: No results for input(s): DDIMER in the last 72 hours. Hgb A1c: Recent Labs    09/01/21 2112  HGBA1C 11.6*   Lipid Profile: No results for input(s): CHOL, HDL, LDLCALC, TRIG, CHOLHDL, LDLDIRECT in the last 72 hours. Thyroid function studies: No results for input(s): TSH, T4TOTAL, T3FREE, THYROIDAB in the last 72 hours.  Invalid input(s): FREET3 Anemia work up: No results for input(s): VITAMINB12, FOLATE,  FERRITIN, TIBC, IRON, RETICCTPCT in the last 72 hours. Sepsis Labs: Recent Labs  Lab 09/01/21 1653 09/02/21 0046 09/03/21 0336 09/04/21 0446  WBC 7.4 8.3 7.2 6.4   Microbiology Recent Results (from the past 240 hour(s))  Resp Panel by RT-PCR (Flu A&B, Covid) Nasopharyngeal Swab     Status: Abnormal   Collection Time: 09/01/21  6:55 PM   Specimen: Nasopharyngeal Swab; Nasopharyngeal(NP) swabs in vial transport medium  Result Value Ref Range  Status   SARS Coronavirus 2 by RT PCR POSITIVE (A) NEGATIVE Final    Comment: (NOTE) SARS-CoV-2 target nucleic acids are DETECTED.  The SARS-CoV-2 RNA is generally detectable in upper respiratory specimens during the acute phase of infection. Positive results are indicative of the presence of the identified virus, but do not rule out bacterial infection or co-infection with other pathogens not detected by the test. Clinical correlation with patient history and other diagnostic information is necessary to determine patient infection status. The expected result is Negative.  Fact Sheet for Patients: BloggerCourse.com  Fact Sheet for Healthcare Providers: SeriousBroker.it  This test is not yet approved or cleared by the [place] FDA and  has been authorized for detection and/or diagnosis of SARS-CoV-2 by FDA under an Emergency Use Authorization (EUA).  This EUA will remain in effect (meaning this test can be used) for the duration of  the COVID-19 declaration under Section 564(b)(1) of the A ct, 21 U.S.C. section 360bbb-3(b)(1), unless the authorization is terminated or revoked sooner.     Influenza A by PCR NEGATIVE NEGATIVE Final   Influenza B by PCR NEGATIVE NEGATIVE Final    Comment: (NOTE) The Xpert Xpress SARS-CoV-2/FLU/RSV plus assay is intended as an aid in the diagnosis of influenza from Nasopharyngeal swab specimens and should not be used as a sole basis for treatment.  Nasal washings and aspirates are unacceptable for Xpert Xpress SARS-CoV-2/FLU/RSV testing.  Fact Sheet for Patients: BloggerCourse.com  Fact Sheet for Healthcare Providers: SeriousBroker.it  This test is not yet approved or cleared by the [place] FDA and has been authorized for detection and/or diagnosis of SARS-CoV-2 by FDA under an Emergency Use Authorization (EUA). This EUA will remain in effect (meaning this test can be used) for the duration of the COVID-19 declaration under Section 564(b)(1) of the Act, 21 U.S.C. section 360bbb-3(b)(1), unless the authorization is terminated or revoked.  Performed at [facility], [address], [place], [place] [address]      Medications:    aspirin EC  81 mg Oral Daily   clopidogrel  75 mg Oral Q breakfast   fluticasone  1 spray Each Nare Daily   heparin  5,000 Units Subcutaneous Q8H   insulin aspart  0-20 Units Subcutaneous TID WC   insulin aspart  0-5 Units Subcutaneous QHS   insulin detemir  10 Units Subcutaneous QHS   losartan  25 mg Oral Daily   metoprolol tartrate  12.5 mg Oral BID   molnupiravir EUA  4 capsule Oral BID   mometasone-formoterol  2 puff Inhalation BID   sodium chloride flush  3 mL Intravenous Q12H   tamsulosin  0.4 mg Oral Daily   Continuous Infusions:  sodium chloride     cefTRIAXone (ROCEPHIN)  IV 1 g (09/04/21 0606)      LOS: 3 days   Marinda Elk  [facility]  09/04/2021, 12:21 PM

## 2021-09-04 NOTE — Progress Notes (Signed)
CARDIAC REHAB PHASE I   Stent/MI education completed with pt via phone due to CoVid restrictions. Pt educated on importance of ASA and Plavix. Pt given MI book and heart healthy diet. Reviewed site care, restrictions, and exercise guidelines. Will refer to CRP II [place].   [id number] Reynold Bowen, RN BSN 09/04/2021 12:14 PM

## 2021-09-04 NOTE — Progress Notes (Signed)
Inpatient Diabetes Program Recommendations  AACE/ADA: New Consensus Statement on Inpatient Glycemic Control (2015)  Target Ranges:  Prepandial:   less than 140 mg/dL      Peak postprandial:   less than 180 mg/dL (1-2 hours)      Critically ill patients:  140 - 180 mg/dL   Lab Results  Component Value Date   GLUCAP 265 (H) 09/04/2021   HGBA1C 11.6 (H) 09/01/2021    Review of Glycemic Control  Diabetes history: DM2 Outpatient Diabetes medications: NPH 70/30 100 units BID Current orders for Inpatient glycemic control: Levemir 9 units BID, Novolog 0-20 units TID with meals and 0-5 HS  CBGs today: 191, 265  Inpatient Diabetes Program Recommendations:    Consider adding meal coverage - Novolog 6 units TID if eating > 50%.  Spoke with pt on phone regarding his diabetes control and HgbA1C of 11.6% - average blood sugar of 286 mg/dL. Pt states he has appt with his doctor on 10/02/21 about his diabetes. States at last appt it was around 9%. Was surprised it had gone up. States he checks blood sugars several times/day and takes Novolin 70/30 100 units at breakfast and 100 units at dinner. Pt said "I guess I'm just too sweet." We discussed healthy diet, exercise and continuing to monitor blood sugars. Talked about weight loss helping with getting blood sugars in control. Pt appreciative of call. Answered questions.   Thank you. Ailene Ards, RD, LDN, CDE Inpatient Diabetes Coordinator [phone]

## 2021-09-04 NOTE — Care Management Important Message (Signed)
Important Message  Patient Details  Name: Lance Shepherd MRN: [id number] Date of Birth: Feb 19, 1946   Medicare Important Message Given:  Yes     Renie Ora 09/04/2021, 11:15 AM

## 2021-09-04 NOTE — Progress Notes (Addendum)
Progress Note  Patient Name: Lance Shepherd Date of Encounter: 09/04/2021  [facility] Cardiologist: Gypsy Balsam, MD   Subjective   Phone call attempted due to COVID status, however unable to reach.  Evaluated in person in the afternoon: Feeling relatively well.  Resting comfortably.  Mild cough but no dyspnea or chest pain.  Inpatient Medications    Scheduled Meds:  aspirin EC  81 mg Oral Daily   clopidogrel  75 mg Oral Q breakfast   fluticasone  1 spray Each Nare Daily   heparin  5,000 Units Subcutaneous Q8H   insulin aspart  0-20 Units Subcutaneous TID WC   insulin aspart  0-5 Units Subcutaneous QHS   insulin detemir  10 Units Subcutaneous QHS   losartan  25 mg Oral Daily   metoprolol tartrate  12.5 mg Oral BID   molnupiravir EUA  4 capsule Oral BID   mometasone-formoterol  2 puff Inhalation BID   sodium chloride flush  3 mL Intravenous Q12H   tamsulosin  0.4 mg Oral Daily   Continuous Infusions:  sodium chloride     cefTRIAXone (ROCEPHIN)  IV 1 g (09/04/21 0606)   PRN Meds: sodium chloride, acetaminophen, nitroGLYCERIN, ondansetron (ZOFRAN) IV, sodium chloride flush   Vital Signs    Vitals:   09/03/21 1741 09/03/21 1803 09/03/21 2030 09/04/21 0610  BP:  109/72 124/79 135/82  Pulse: (!) 0 93 94 95  Resp: (!) 0 18 18   Temp:   97.9 F (36.6 C) (!) 97.5 F (36.4 C)  TempSrc:   Oral Oral  SpO2: 91% 96% 95% 93%  Weight:      Height:        Intake/Output Summary (Last 24 hours) at 09/04/2021 0734 Last data filed at 09/03/2021 1123 Gross per 24 hour  Intake --  Output 300 ml  Net -300 ml   Last 3 Weights 09/01/2021 09/01/2021 08/23/2021  Weight (lbs) 250 lb 14.1 oz 253 lb 8.5 oz 254 lb 6.4 oz  Weight (kg) 113.8 kg 115 kg 115.395 kg      Telemetry    NSR, HR 90s, PVCs - Personally Reviewed  ECG    NSR, RBBB, 95bpm, no changes - Personally Reviewed  Physical Exam   Exam per MD GEN: No acute distress.   Neck: No JVD Cardiac: Normal  S1-S2.  RRR, soft 1/6 SEM with soft S4 gallop.  No rubs.  (Very distant sounds with isolation disposable stethoscope) Respiratory: Clear to auscultation bilaterally.  Nonlabored, good air movement. GI: Soft, nontender, non-distended  MS: No edema; No deformity. Neuro:  Nonfocal  Psych: Normal affect   Labs    High Sensitivity Troponin:   Recent Labs  Lab 09/01/21 1653 09/01/21 1830 09/01/21 2212 09/02/21 0046  TROPONINIHS 11,378* 13,568* 22,846* 22,153*     Chemistry Recent Labs  Lab 09/01/21 1653 09/03/21 0336 09/04/21 0446  NA 132* 135 135  K 4.5 4.8 4.6  CL 97* 100 103  CO2 25 25 21*  GLUCOSE 255* 212* 217*  BUN CREATININE 1.21 1.29* 1.26*  CALCIUM 8.9 8.7* 8.2*  PROT  --  6.2*  --   ALBUMIN  --  2.9*  --   AST  --  44*  --   ALT  --  31  --   ALKPHOS  --  55  --   BILITOT  --  1.1  --   GFRNONAA >60 57* 59*  ANIONGAP 10 10 11  Lipids No results for input(s): CHOL, TRIG, HDL, LABVLDL, LDLCALC, CHOLHDL in the last 168 hours.  Hematology Recent Labs  Lab 09/02/21 0046 09/03/21 0336 09/04/21 0446  WBC 8.3 7.2 6.4  RBC 5.12 5.10 4.75  HGB 14.9 14.9 13.7  HCT 45.8 45.8 43.5  MCV 89.5 89.8 91.6  MCH 29.1 29.2 28.8  MCHC 32.5 32.5 31.5  RDW 13.7 14.0 14.3  PLT 153 171 169   Thyroid No results for input(s): TSH, FREET4 in the last 168 hours.  BNP Recent Labs  Lab 09/02/21 1803  BNP 140.0*    DDimer No results for input(s): DDIMER in the last 168 hours.   Radiology    CARDIAC CATHETERIZATION  Result Date: 09/03/2021   Prox RCA lesion is 50% stenosed.   Dist RCA lesion is 70% stenosed.   1st Mrg lesion is 99% stenosed.   Prox LAD to Mid LAD lesion is 50% stenosed.   A drug-eluting stent was successfully placed using a SYNERGY XD 3.0X20.   Post intervention, there is a 0% residual stenosis. Subtotal occlusion of a large OM 1 branch, treated successfully with PCI using a 3.0x20 mm Synergy DES, improving the lesion from 99--->0%,  TIMI-2---->TIMI-3 flow Moderate segmental RCA stenosis Moderate diffuse proximal LAD stenosis Recommend: DAPT with ASA and plavix minimum 12 months, aggressive risk reduction measures, medical therapy for residual CAD. If recurrent angina, could assess RCA and LAD for intervention as needed.   ECHOCARDIOGRAM COMPLETE  Result Date: 09/03/2021    ECHOCARDIOGRAM REPORT   Patient Name:   Lance Shepherd Date of Exam: 09/03/2021 Medical Rec #:  [id number]    Height:       70.0 in Accession #:    [id number]   Weight:       250.9 lb Date of Birth:  17-Aug-1946    BSA:          2.298 m Patient Age:    75 years     BP:           122/75 mmHg Patient Gender: M            HR:           92 bpm. Exam Location:  Inpatient Procedure: 2D Echo, Cardiac Doppler, Color Doppler and Intracardiac            Opacification Agent Indications:    Acute myocardial infarction, unspecified I21.9                 CHF-Acute Systolic I50.21  History:        Patient has prior history of Echocardiogram examinations, most                 recent 09/19/2020. Previous Myocardial Infarction and CAD, COPD;                 Risk Factors:Sleep Apnea, Dyslipidemia, Diabetes, Hypertension                 and Former Smoker. COVID. Venous insufficiency.  Sonographer:    Leta Jungling RDCS Referring Phys: [id number] Barry Dienes [facility]  Sonographer Comments: Image acquisition challenging due to respiratory motion. PLAX measurements attempted. IMPRESSIONS  1. LV not well seen even with definity abnormal septal motion . Left ventricular ejection fraction, by estimation, is 50 to 55%. The left ventricle has low normal function. The left ventricle has no regional wall motion abnormalities. Left ventricular diastolic parameters were normal.  2. Right ventricular systolic function is normal. The right  ventricular size is normal. There is normal pulmonary artery systolic pressure.  3. The mitral valve is normal in structure. No evidence of mitral valve regurgitation. No evidence of  mitral stenosis.  4. The aortic valve was not well visualized. There is mild calcification of the aortic valve. Aortic valve regurgitation is not visualized. Aortic valve sclerosis is present, with no evidence of aortic valve stenosis.  5. The inferior vena cava is normal in size with greater than 50% respiratory variability, suggesting right atrial pressure of 3 mmHg. FINDINGS  Left Ventricle: LV not well seen even with definity abnormal septal motion. Left ventricular ejection fraction, by estimation, is 50 to 55%. The left ventricle has low normal function. The left ventricle has no regional wall motion abnormalities. Definity contrast agent was given IV to delineate the left ventricular endocardial borders. The left ventricular internal cavity size was normal in size. There is no left ventricular hypertrophy. Left ventricular diastolic parameters were normal. Right Ventricle: The right ventricular size is normal. No increase in right ventricular wall thickness. Right ventricular systolic function is normal. There is normal pulmonary artery systolic pressure. The tricuspid regurgitant velocity is 1.93 m/s, and  with an assumed right atrial pressure of 15 mmHg, the estimated right ventricular systolic pressure is 29.9 mmHg. Left Atrium: Left atrial size was normal in size. Right Atrium: Right atrial size was normal in size. Pericardium: There is no evidence of pericardial effusion. Mitral Valve: The mitral valve is normal in structure. No evidence of mitral valve regurgitation. No evidence of mitral valve stenosis. Tricuspid Valve: The tricuspid valve is normal in structure. Tricuspid valve regurgitation is not demonstrated. No evidence of tricuspid stenosis. Aortic Valve: The aortic valve was not well visualized. There is mild calcification of the aortic valve. Aortic valve regurgitation is not visualized. Aortic valve sclerosis is present, with no evidence of aortic valve stenosis. Pulmonic Valve: The pulmonic  valve was normal in structure. Pulmonic valve regurgitation is not visualized. No evidence of pulmonic stenosis. Aorta: The aortic root is normal in size and structure. Venous: The inferior vena cava is normal in size with greater than 50% respiratory variability, suggesting right atrial pressure of 3 mmHg. IAS/Shunts: No atrial level shunt detected by color flow Doppler.  LEFT VENTRICLE PLAX 2D LVIDd:         4.60 cm      Diastology LVIDs:         3.20 cm      LV e' medial:    5.22 cm/s LV PW:         1.20 cm      LV E/e' medial:  12.9 LV IVS:        1.20 cm      LV e' lateral:   7.07 cm/s LVOT diam:     2.20 cm      LV E/e' lateral: 9.5 LV SV:         45 LV SV Index:   20 LVOT Area:     3.80 cm  LV Volumes (MOD) LV vol d, MOD A2C: 115.0 ml LV vol d, MOD A4C: 108.0 ml LV vol s, MOD A2C: 75.6 ml LV vol s, MOD A4C: 76.3 ml LV SV MOD A2C:     39.4 ml LV SV MOD A4C:     108.0 ml LV SV MOD BP:      35.0 ml RIGHT VENTRICLE TAPSE (M-mode): 1.4 cm LEFT ATRIUM  Index LA Vol (A2C):   37.4 ml 16.27 ml/m LA Vol (A4C):   52.3 ml 22.75 ml/m LA Biplane Vol: 47.4 ml 20.62 ml/m  AORTIC VALVE LVOT Vmax:   64.60 cm/s LVOT Vmean:  44.800 cm/s LVOT VTI:    0.118 m  AORTA Ao Root diam: 3.60 cm Ao Asc diam:  3.20 cm MITRAL VALVE               TRICUSPID VALVE MV Area (PHT): 2.39 cm    TR Peak grad:   14.9 mmHg MV Decel Time: 317 msec    TR Vmax:        193.00 cm/s MV E velocity: 67.30 cm/s MV A velocity: 94.70 cm/s  SHUNTS MV E/A ratio:  0.71        Systemic VTI:  0.12 m                            Systemic Diam: 2.20 cm Charlton Haws MD Electronically signed by Charlton Haws MD Signature Date/Time: 09/03/2021/3:07:03 PM    Final     Cardiac Studies   Cardiac cath-PCI 09/03/76: pRCA 50%, dRCA 70% (Rec Med Rx), P-mLAD ~50% @ D1. ;  99% subtotal large OM1 Diagnostic Dominance: Right  Intervention - Subtotal occlusion of a large OM 1 branch, treated successfully with PCI using a 3.0x20 mm Synergy DES, improving the lesion  from 99--->0%, TIMI-2---->TIMI-3 flow       Recommend: DAPT with ASA and plavix minimum 12 months, aggressive risk reduction measures, medical therapy for residual CAD. If recurrent angina, could assess RCA and LAD for intervention as needed.     Echo 09/03/21: Abnormal septal motion due to bundle branch block.  EF 50 to 55%.  Otherwise no obvious RWM A.  Normal diastolic parameters.  Normal RV.  Normal mitral valve.  Aortic valve calcification with sclerosis but no stenosis.  Normal RVP/RAP.   Patient Profile     75 y.o. male  with a hx of coronary artery disease status post prior PCI of the RCA and LCx in 2016 and subsequent non-STEMI in 2019 with DES to the OM and angioplasty to the distal OM, diabetes mellitus II, hyperlipidemia (intolerant to statins), hypertension, venous insufficiency, BPH, COPD, sleep apnea and recent COVID who is being seen 09/01/2021 for the evaluation of chest pain.   Assessment & Plan    NSTEMI - presented with chest pain. HS trop >22000 started on IV heparin - Echo showed LVEF 50-55%, no WMA - cath showed 50% lesions, 70% stenosis, 1st Mrg lesion 99% stenosis. Treated with DES to subtotalled OM1 - Recommend DAPT (Hx of intolerance to Brilinta)  => ASA and Plavix minimum 12 months. -Continue beta-blocker-restart ARB once renal function proves stable  HLD - LDL 108 - given intolerance to statins, plan for referral to lipid clinic  AKI (creatinine from 1.21 up to 1.26, this does not meet criteria for AKI) - Losartan held for cath - restart once renal Fxn stable post cath - kidney function stable  COVID UTI - per primary team  DM2 - uncontrolled 11.6% - SSI per IM  HTN - continue BB - Losartan held f to avoid AKI>>restart when able -> if renal function stable tomorrow, will restart.   For questions or updates, please contact [facility] Please consult [email or web address] for contact info under        Signed, Cadence  Stall, PA-C   09/04/2021, 7:34 AM  ATTENDING ATTESTATION  I have seen, examined and evaluated the patient this PM.  After reviewing all the available data and chart, we discussed the patients laboratory, study & physical findings as well as symptoms in detail. I agree with note written by Terrilee Croak, PA (as scribe).  The findings, examination as well as impression recommendations as per our discussion.  Exam performed by me.  Attending adjustments noted in italics.   He is actually pretty well.  Feeling close to normal.  Compared to how bad he felt upon initial evaluation, feels 90% better.  Overall he is doing pretty well.  I think if his renal function proves to be stable, we can restart his ARB dose.  Overall is pretty stable from cardiac standpoint and is nearing stability for discharge, provided his renal function remained stable.    Bryan Lemma, M.D., M.S. Interventional Cardiologist   Pager # [phone] Phone # [phone] [address] [place], [place] [address]

## 2021-09-05 ENCOUNTER — Other Ambulatory Visit (HOSPITAL_COMMUNITY): Payer: Self-pay

## 2021-09-05 DIAGNOSIS — U071 COVID-19: Secondary | ICD-10-CM | POA: Diagnosis not present

## 2021-09-05 DIAGNOSIS — I214 Non-ST elevation (NSTEMI) myocardial infarction: Secondary | ICD-10-CM | POA: Diagnosis not present

## 2021-09-05 DIAGNOSIS — E1169 Type 2 diabetes mellitus with other specified complication: Secondary | ICD-10-CM | POA: Diagnosis not present

## 2021-09-05 LAB — BASIC METABOLIC PANEL
Anion gap: 7 (ref 5–15)
BUN: 10 mg/dL (ref 8–23)
CO2: 28 mmol/L (ref 22–32)
Calcium: 8.9 mg/dL (ref 8.9–10.3)
Chloride: 102 mmol/L (ref 98–111)
Creatinine, Ser: 1.16 mg/dL (ref 0.61–1.24)
GFR, Estimated: >60 mL/min (ref >60)
Glucose, Bld: 217 mg/dL — ABNORMAL HIGH (ref 70–99)
Potassium: 4.7 mmol/L (ref 3.5–5.1)
Sodium: 137 mmol/L (ref 135–145)

## 2021-09-05 LAB — LIPID PANEL
Cholesterol: 153 mg/dL (ref 0–200)
HDL: 27 mg/dL — ABNORMAL LOW (ref >40)
LDL Cholesterol: 108 mg/dL — ABNORMAL HIGH (ref 0–99)
Total CHOL/HDL Ratio: 5.7 RATIO
Triglycerides: 91 mg/dL (ref <150)
VLDL: 18 mg/dL (ref 0–40)

## 2021-09-05 LAB — GLUCOSE, CAPILLARY
Glucose-Capillary: 205 mg/dL — ABNORMAL HIGH (ref 70–99)
Glucose-Capillary: 196 mg/dL — ABNORMAL HIGH (ref 70–99)

## 2021-09-05 MED ORDER — LOSARTAN POTASSIUM 50 MG PO TABS
50.0000 mg | ORAL_TABLET | Freq: Every day | ORAL | 3 refills | Status: DC
Start: 2021-09-06 — End: 2023-01-22
  Filled 2021-09-05: qty 30, 30d supply, fill #0

## 2021-09-05 MED ORDER — LOSARTAN POTASSIUM 50 MG PO TABS: 50.0000 mg | ORAL_TABLET | Freq: Every day | ORAL | Status: DC

## 2021-09-05 MED ORDER — ASPIRIN 81 MG PO TBEC
81.0000 mg | DELAYED_RELEASE_TABLET | Freq: Every day | ORAL | 11 refills | Status: AC
  Filled 2021-09-05: qty 30, 30d supply, fill #0

## 2021-09-05 MED ORDER — NITROGLYCERIN 0.4 MG SL SUBL
0.4000 mg | SUBLINGUAL_TABLET | SUBLINGUAL | 1 refills | Status: AC | PRN
  Filled 2021-09-05: qty 25, 5d supply, fill #0

## 2021-09-05 MED ORDER — LISINOPRIL 5 MG PO TABS: 5.0000 mg | ORAL_TABLET | Freq: Every day | ORAL | Status: DC

## 2021-09-05 MED ORDER — CLOPIDOGREL BISULFATE 75 MG PO TABS
75.0000 mg | ORAL_TABLET | Freq: Every day | ORAL | 3 refills | Status: DC
  Filled 2021-09-05: qty 30, 30d supply, fill #0

## 2021-09-05 MED ORDER — METOPROLOL TARTRATE 50 MG PO TABS: 50.0000 mg | ORAL_TABLET | Freq: Two times a day (BID) | ORAL | Status: DC

## 2021-09-05 MED ORDER — METOPROLOL TARTRATE 50 MG PO TABS
50.0000 mg | ORAL_TABLET | Freq: Two times a day (BID) | ORAL | 3 refills | Status: AC
  Filled 2021-09-05: qty 60, 30d supply, fill #0

## 2021-09-05 NOTE — Discharge Summary (Addendum)
Physician Discharge Summary  Lance Shepherd ZOX:[id number] DOB: April 22, 1946 DOA: 09/01/2021  PCP: [name], MD  Admit date: 09/01/2021 Discharge date: 09/05/2021  Admitted From: Home Disposition:  Home  Recommendations for Outpatient Follow-up:  Follow up with PCP in 1-2 weeks Please obtain BMP/CBC in one week   Home Health:No Equipment/Devices:None  Discharge Condition:Stable CODE STATUS:Full Diet recommendation: Heart Healthy   Brief/Interim Summary: 75 y.o. male past medical history of CAD with PCI to the RCA and circumflex in 2016 then again in April 2019, insulin-dependent diabetes mellitus type 2, essential hypertension, statin intolerance recently diagnosed with symptomatic COVID on 08/29/2021 started on Molnupiravir giving to the ED on Christmas Day with chest pain resolved with nitroglycerin, found to have an NSTEMI started on IV heparin cardiac cath was done on 09/03/2021  Discharge Diagnoses:  Principal Problem:   NSTEMI (non-ST elevated myocardial infarction) [facility]) Active Problems:   Diabetes mellitus (HCC)   Coronary artery disease stent to RCA and circumflex in November 2017, stent to obtuse marginal branch in 2019   Chronic obstructive pulmonary disease (HCC)   Diabetes mellitus type 2 in obese (HCC)   Hyperlipidemia   Statin intolerance   COVID-19 virus infection  NSTEMI: Cardiology was consulted, he was started on aspirin, beta-blocker nitroglycerin and statins. They recommended left heart cath and he status post Des to the LAD, he will continue dual antiplatelet therapy aspirin and Plavix for at least 12 months. Physical therapy evaluated the patient and recommended home health PT.  Hyperlipidemia: Statin intolerance will be referred to the lipid clinic as an outpatient.  Acute kidney injury: Losartan was held for cardiac cath renal function remained stable he will resume losartan as an outpatient.  History of COVID-19 infection: Has remained  asymptomatic has completed his isolation.  Essential hypertension: No changes made to his medication.  Insulin-dependent diabetes mellitus: Will continue his current regimen no changes made.    Discharge Instructions  Discharge Instructions     Amb Referral to Cardiac Rehabilitation   Complete by: As directed    Will send Cardiac Rehab Phase 2 referral to [place]   Diagnosis:  NSTEMI Coronary Stents     After initial evaluation and assessments completed: Virtual Based Care may be provided alone or in conjunction with Phase 2 Cardiac Rehab based on patient barriers.: Yes   Diet - low sodium heart healthy   Complete by: As directed    Increase activity slowly   Complete by: As directed       Allergies as of 09/05/2021       Reactions   Ticagrelor Shortness Of Breath   Pioglitazone Diarrhea   Statins Nausea Only, Rash        Medication List     STOP taking these medications    Lagevrio 200 MG Caps capsule Generic drug: molnupiravir EUA       TAKE these medications    Aspirin Low Dose 81 MG EC tablet Generic drug: aspirin Take 1 tablet (81 mg total) by mouth daily. Swallow whole. Start taking on: September 06, 2021   benzonatate 100 MG capsule Commonly known as: TESSALON Take 100 mg by mouth 3 (three) times daily as needed.   clopidogrel 75 MG tablet Commonly known as: PLAVIX Take 1 tablet (75 mg total) by mouth daily with breakfast. Start taking on: September 06, 2021   fluticasone 50 MCG/ACT nasal spray Commonly known as: FLONASE Place 1 spray into both nostrils daily.   Fluticasone-Salmeterol 250-50 MCG/DOSE Aepb Commonly known  as: ADVAIR Inhale 1 puff into the lungs as needed for shortness of breath.   furosemide 20 MG tablet Commonly known as: LASIX Take 20 mg by mouth as needed for fluid or edema.   gabapentin 300 MG capsule Commonly known as: NEURONTIN Take 300 mg by mouth 3 (three) times daily.   insulin NPH-regular Human (70-30) 100  UNIT/ML injection Inject 100 Units into the skin 2 (two) times daily with a meal.   losartan 50 MG tablet Commonly known as: COZAAR Take 1 tablet (50 mg total) by mouth daily. Start taking on: September 06, 2021 What changed:  medication strength how much to take   metoprolol tartrate 50 MG tablet Commonly known as: LOPRESSOR Take 1 tablet (50 mg total) by mouth 2 (two) times daily.   nitroGLYCERIN 0.4 MG SL tablet Commonly known as: NITROSTAT Place 1 tablet (0.4 mg total) under the tongue every 5 (five) minutes as needed for chest pain.   tamsulosin 0.4 MG Caps capsule Commonly known as: FLOMAX Take 0.4 mg by mouth daily.        Allergies  Allergen Reactions   Ticagrelor Shortness Of Breath   Pioglitazone Diarrhea   Statins Nausea Only and Rash    Consultations: Cardiology   Procedures/Studies: CARDIAC CATHETERIZATION  Result Date: 09/03/2021   Prox RCA lesion is 50% stenosed.   Dist RCA lesion is 70% stenosed.   1st Mrg lesion is 99% stenosed.   Prox LAD to Mid LAD lesion is 50% stenosed.   A drug-eluting stent was successfully placed using a SYNERGY XD 3.0X20.   Post intervention, there is a 0% residual stenosis. Subtotal occlusion of a large OM 1 branch, treated successfully with PCI using a 3.0x20 mm Synergy DES, improving the lesion from 99--->0%, TIMI-2---->TIMI-3 flow Moderate segmental RCA stenosis Moderate diffuse proximal LAD stenosis Recommend: DAPT with ASA and plavix minimum 12 months, aggressive risk reduction measures, medical therapy for residual CAD. If recurrent angina, could assess RCA and LAD for intervention as needed.   DG Chest Port 1 View  Result Date: 09/01/2021 CLINICAL DATA:  Chest pain and nausea/vomiting since yesterday, hemodynamically stable, symptoms responded to nitroglycerin yesterday but not today EXAM: PORTABLE CHEST 1 VIEW COMPARISON:  Portable exam 1639 hours compared to 12/18/2019 FINDINGS: Minimal enlargement of cardiac silhouette  with pulmonary vascular congestion. Mediastinal contours normal. Lordotic positioning. Chronic accentuation of RIGHT perihilar markings. No definite acute infiltrate, pleural effusion, or pneumothorax. Bones demineralized. IMPRESSION: Enlargement of cardiac silhouette with pulmonary vascular congestion. No definite acute abnormalities. Electronically Signed   By: Ulyses Southward M.D.   On: 09/01/2021 16:52   ECHOCARDIOGRAM COMPLETE  Result Date: 09/03/2021    ECHOCARDIOGRAM REPORT   Patient Name:   DEAKON FRIX Date of Exam: 09/03/2021 Medical Rec #:  [id number]    Height:       70.0 in Accession #:    [id number]   Weight:       250.9 lb Date of Birth:  August 30, 1946    BSA:          2.298 m Patient Age:    75 years     BP:           122/75 mmHg Patient Gender: M            HR:           92 bpm. Exam Location:  Inpatient Procedure: 2D Echo, Cardiac Doppler, Color Doppler and Intracardiac  Opacification Agent Indications:    Acute myocardial infarction, unspecified I21.9                 CHF-Acute Systolic I50.21  History:        Patient has prior history of Echocardiogram examinations, most                 recent 09/19/2020. Previous Myocardial Infarction and CAD, COPD;                 Risk Factors:Sleep Apnea, Dyslipidemia, Diabetes, Hypertension                 and Former Smoker. COVID. Venous insufficiency.  Sonographer:    Leta Jungling RDCS Referring Phys: [phone] Barry Dienes [facility]  Sonographer Comments: Image acquisition challenging due to respiratory motion. PLAX measurements attempted. IMPRESSIONS  1. LV not well seen even with definity abnormal septal motion . Left ventricular ejection fraction, by estimation, is 50 to 55%. The left ventricle has low normal function. The left ventricle has no regional wall motion abnormalities. Left ventricular diastolic parameters were normal.  2. Right ventricular systolic function is normal. The right ventricular size is normal. There is normal pulmonary artery systolic  pressure.  3. The mitral valve is normal in structure. No evidence of mitral valve regurgitation. No evidence of mitral stenosis.  4. The aortic valve was not well visualized. There is mild calcification of the aortic valve. Aortic valve regurgitation is not visualized. Aortic valve sclerosis is present, with no evidence of aortic valve stenosis.  5. The inferior vena cava is normal in size with greater than 50% respiratory variability, suggesting right atrial pressure of 3 mmHg. FINDINGS  Left Ventricle: LV not well seen even with definity abnormal septal motion. Left ventricular ejection fraction, by estimation, is 50 to 55%. The left ventricle has low normal function. The left ventricle has no regional wall motion abnormalities. Definity contrast agent was given IV to delineate the left ventricular endocardial borders. The left ventricular internal cavity size was normal in size. There is no left ventricular hypertrophy. Left ventricular diastolic parameters were normal. Right Ventricle: The right ventricular size is normal. No increase in right ventricular wall thickness. Right ventricular systolic function is normal. There is normal pulmonary artery systolic pressure. The tricuspid regurgitant velocity is 1.93 m/s, and  with an assumed right atrial pressure of 15 mmHg, the estimated right ventricular systolic pressure is 29.9 mmHg. Left Atrium: Left atrial size was normal in size. Right Atrium: Right atrial size was normal in size. Pericardium: There is no evidence of pericardial effusion. Mitral Valve: The mitral valve is normal in structure. No evidence of mitral valve regurgitation. No evidence of mitral valve stenosis. Tricuspid Valve: The tricuspid valve is normal in structure. Tricuspid valve regurgitation is not demonstrated. No evidence of tricuspid stenosis. Aortic Valve: The aortic valve was not well visualized. There is mild calcification of the aortic valve. Aortic valve regurgitation is not  visualized. Aortic valve sclerosis is present, with no evidence of aortic valve stenosis. Pulmonic Valve: The pulmonic valve was normal in structure. Pulmonic valve regurgitation is not visualized. No evidence of pulmonic stenosis. Aorta: The aortic root is normal in size and structure. Venous: The inferior vena cava is normal in size with greater than 50% respiratory variability, suggesting right atrial pressure of 3 mmHg. IAS/Shunts: No atrial level shunt detected by color flow Doppler.  LEFT VENTRICLE PLAX 2D LVIDd:         4.60 cm  Diastology LVIDs:         3.20 cm      LV e' medial:    5.22 cm/s LV PW:         1.20 cm      LV E/e' medial:  12.9 LV IVS:        1.20 cm      LV e' lateral:   7.07 cm/s LVOT diam:     2.20 cm      LV E/e' lateral: 9.5 LV SV:         45 LV SV Index:   20 LVOT Area:     3.80 cm  LV Volumes (MOD) LV vol d, MOD A2C: 115.0 ml LV vol d, MOD A4C: 108.0 ml LV vol s, MOD A2C: 75.6 ml LV vol s, MOD A4C: 76.3 ml LV SV MOD A2C:     39.4 ml LV SV MOD A4C:     108.0 ml LV SV MOD BP:      35.0 ml RIGHT VENTRICLE TAPSE (M-mode): 1.4 cm LEFT ATRIUM             Index LA Vol (A2C):   37.4 ml 16.27 ml/m LA Vol (A4C):   52.3 ml 22.75 ml/m LA Biplane Vol: 47.4 ml 20.62 ml/m  AORTIC VALVE LVOT Vmax:   64.60 cm/s LVOT Vmean:  44.800 cm/s LVOT VTI:    0.118 m  AORTA Ao Root diam: 3.60 cm Ao Asc diam:  3.20 cm MITRAL VALVE               TRICUSPID VALVE MV Area (PHT): 2.39 cm    TR Peak grad:   14.9 mmHg MV Decel Time: 317 msec    TR Vmax:        193.00 cm/s MV E velocity: 67.30 cm/s MV A velocity: 94.70 cm/s  SHUNTS MV E/A ratio:  0.71        Systemic VTI:  0.12 m                            Systemic Diam: 2.20 cm Charlton Haws MD Electronically signed by Charlton Haws MD Signature Date/Time: 09/03/2021/3:07:03 PM    Final     Subjective: No complaints  Discharge Exam: Vitals:   09/05/21 0444 09/05/21 1100  BP: (!) 136/95 (!) 142/100  Pulse: 86 92  Resp: 18   Temp: 98 F (36.7 C)   SpO2:  96%    Vitals:   09/04/21 1830 09/04/21 1935 09/05/21 0444 09/05/21 1100  BP:  138/90 (!) 136/95 (!) 142/100  Pulse:  97 86 92  Resp:   18   Temp:  97.9 F (36.6 C) 98 F (36.7 C)   TempSrc:  Oral Oral   SpO2: 96% 94% 96%   Weight:      Height:        General: Pt is alert, awake, not in acute distress Cardiovascular: RRR, S1/S2 +, no rubs, no gallops Respiratory: CTA bilaterally, no wheezing, no rhonchi Abdominal: Soft, NT, ND, bowel sounds + Extremities: no edema, no cyanosis    The results of significant diagnostics from this hospitalization (including imaging, microbiology, ancillary and laboratory) are listed below for reference.     Microbiology: Recent Results (from the past 240 hour(s))  Resp Panel by RT-PCR (Flu A&B, Covid) Nasopharyngeal Swab     Status: Abnormal   Collection Time: 09/01/21  6:55 PM   Specimen: Nasopharyngeal Swab; Nasopharyngeal(NP) swabs  in vial transport medium  Result Value Ref Range Status   SARS Coronavirus 2 by RT PCR POSITIVE (A) NEGATIVE Final    Comment: (NOTE) SARS-CoV-2 target nucleic acids are DETECTED.  The SARS-CoV-2 RNA is generally detectable in upper respiratory specimens during the acute phase of infection. Positive results are indicative of the presence of the identified virus, but do not rule out bacterial infection or co-infection with other pathogens not detected by the test. Clinical correlation with patient history and other diagnostic information is necessary to determine patient infection status. The expected result is Negative.  Fact Sheet for Patients: BloggerCourse.com  Fact Sheet for Healthcare Providers: SeriousBroker.it  This test is not yet approved or cleared by the [place] FDA and  has been authorized for detection and/or diagnosis of SARS-CoV-2 by FDA under an Emergency Use Authorization (EUA).  This EUA will remain in effect (meaning this test  can be used) for the duration of  the COVID-19 declaration under Section 564(b)(1) of the A ct, 21 U.S.C. section 360bbb-3(b)(1), unless the authorization is terminated or revoked sooner.     Influenza A by PCR NEGATIVE NEGATIVE Final   Influenza B by PCR NEGATIVE NEGATIVE Final    Comment: (NOTE) The Xpert Xpress SARS-CoV-2/FLU/RSV plus assay is intended as an aid in the diagnosis of influenza from Nasopharyngeal swab specimens and should not be used as a sole basis for treatment. Nasal washings and aspirates are unacceptable for Xpert Xpress SARS-CoV-2/FLU/RSV testing.  Fact Sheet for Patients: BloggerCourse.com  Fact Sheet for Healthcare Providers: SeriousBroker.it  This test is not yet approved or cleared by the [place] FDA and has been authorized for detection and/or diagnosis of SARS-CoV-2 by FDA under an Emergency Use Authorization (EUA). This EUA will remain in effect (meaning this test can be used) for the duration of the COVID-19 declaration under Section 564(b)(1) of the Act, 21 U.S.C. section 360bbb-3(b)(1), unless the authorization is terminated or revoked.  Performed at [facility], [address] [place], [place] [address]      Labs: BNP (last 3 results) Recent Labs    09/02/21 1803  BNP 140.0*   Basic Metabolic Panel: Recent Labs  Lab 09/01/21 1653 09/03/21 0336 09/04/21 0446 09/05/21 0145  NA 132* 135 135 137  K 4.5 4.8 4.6 4.7  CL 97* 100 103 102  CO2 25 25 21* 28  GLUCOSE 255* 212* 217* 217*  BUN 12 17 15 10   CREATININE 1.21 1.29* 1.26* 1.16  CALCIUM 8.9 8.7* 8.2* 8.9   Liver Function Tests: Recent Labs  Lab 09/03/21 0336  AST 44*  ALT 31  ALKPHOS 55  BILITOT 1.1  PROT 6.2*  ALBUMIN 2.9*   No results for input(s): LIPASE, AMYLASE in the last 168 hours. No results for input(s): AMMONIA in the last 168 hours. CBC: Recent Labs  Lab 09/01/21 1653 09/02/21 0046  09/03/21 0336 09/04/21 0446  WBC 7.4 8.3 7.2 6.4  NEUTROABS 4.4  --   --   --   HGB 16.0 14.9 14.9 13.7  HCT 48.8 45.8 45.8 43.5  MCV 90.0 89.5 89.8 91.6  PLT 173 153 171 169   Cardiac Enzymes: No results for input(s): CKTOTAL, CKMB, CKMBINDEX, TROPONINI in the last 168 hours. BNP: Invalid input(s): POCBNP CBG: Recent Labs  Lab 09/04/21 1117 09/04/21 1632 09/04/21 2139 09/05/21 0740 09/05/21 1231  GLUCAP 265* 205* 142* 205* 196*   D-Dimer No results for input(s): DDIMER in the last 72 hours. Hgb A1c No  results for input(s): HGBA1C in the last 72 hours. Lipid Profile Recent Labs    09/05/21 0145  CHOL 153  HDL 27*  LDLCALC 108*  TRIG 91  CHOLHDL 5.7   Thyroid function studies No results for input(s): TSH, T4TOTAL, T3FREE, THYROIDAB in the last 72 hours.  Invalid input(s): FREET3 Anemia work up No results for input(s): VITAMINB12, FOLATE, FERRITIN, TIBC, IRON, RETICCTPCT in the last 72 hours. Urinalysis    Component Value Date/Time   COLORURINE YELLOW 09/03/2021 0426   APPEARANCEUR CLOUDY (A) 09/03/2021 0426   LABSPEC 1.010 09/03/2021 0426   PHURINE 7.5 09/03/2021 0426   GLUCOSEU NEGATIVE 09/03/2021 0426   HGBUR LARGE (A) 09/03/2021 0426   BILIRUBINUR NEGATIVE 09/03/2021 0426   KETONESUR NEGATIVE 09/03/2021 0426   PROTEINUR NEGATIVE 09/03/2021 0426   NITRITE POSITIVE (A) 09/03/2021 0426   LEUKOCYTESUR SMALL (A) 09/03/2021 0426   Sepsis Labs Invalid input(s): PROCALCITONIN,  WBC,  LACTICIDVEN Microbiology Recent Results (from the past 240 hour(s))  Resp Panel by RT-PCR (Flu A&B, Covid) Nasopharyngeal Swab     Status: Abnormal   Collection Time: 09/01/21  6:55 PM   Specimen: Nasopharyngeal Swab; Nasopharyngeal(NP) swabs in vial transport medium  Result Value Ref Range Status   SARS Coronavirus 2 by RT PCR POSITIVE (A) NEGATIVE Final    Comment: (NOTE) SARS-CoV-2 target nucleic acids are DETECTED.  The SARS-CoV-2 RNA is generally detectable in upper  respiratory specimens during the acute phase of infection. Positive results are indicative of the presence of the identified virus, but do not rule out bacterial infection or co-infection with other pathogens not detected by the test. Clinical correlation with patient history and other diagnostic information is necessary to determine patient infection status. The expected result is Negative.  Fact Sheet for Patients: BloggerCourse.com  Fact Sheet for Healthcare Providers: SeriousBroker.it  This test is not yet approved or cleared by the [place] FDA and  has been authorized for detection and/or diagnosis of SARS-CoV-2 by FDA under an Emergency Use Authorization (EUA).  This EUA will remain in effect (meaning this test can be used) for the duration of  the COVID-19 declaration under Section 564(b)(1) of the A ct, 21 U.S.C. section 360bbb-3(b)(1), unless the authorization is terminated or revoked sooner.     Influenza A by PCR NEGATIVE NEGATIVE Final   Influenza B by PCR NEGATIVE NEGATIVE Final    Comment: (NOTE) The Xpert Xpress SARS-CoV-2/FLU/RSV plus assay is intended as an aid in the diagnosis of influenza from Nasopharyngeal swab specimens and should not be used as a sole basis for treatment. Nasal washings and aspirates are unacceptable for Xpert Xpress SARS-CoV-2/FLU/RSV testing.  Fact Sheet for Patients: BloggerCourse.com  Fact Sheet for Healthcare Providers: SeriousBroker.it  This test is not yet approved or cleared by the [place] FDA and has been authorized for detection and/or diagnosis of SARS-CoV-2 by FDA under an Emergency Use Authorization (EUA). This EUA will remain in effect (meaning this test can be used) for the duration of the COVID-19 declaration under Section 564(b)(1) of the Act, 21 U.S.C. section 360bbb-3(b)(1), unless the authorization is  terminated or revoked.  Performed at [facility], [address], [place], [place] [address]      SIGNED:   Marinda Elk, MD  [facility] 09/05/2021, 1:06 PM Pager   If 7PM-7AM, please contact night-coverage [email or web address] Password [email or web address]

## 2021-09-05 NOTE — Progress Notes (Signed)
CARDIAC REHAB PHASE I   Spoke with pt and daughter via room phone. Reinforced MI/stent education. Questions/ concerns addressed. Referred to [facility].  D/c today.  [phone] Reynold Bowen, RN BSN 09/05/2021 12:23 PM

## 2021-09-05 NOTE — Progress Notes (Signed)
SATURATION QUALIFICATIONS: (This note is used to comply with regulatory documentation for home oxygen)  Patient Saturations on Room Air at Rest = 95%  Patient Saturations on Room Air while Ambulating = 94-97%  Patient Saturations on NA Liters of oxygen while Ambulating = NA  Please briefly explain why patient needs home oxygen:  Patient covid positive, ambulated patient in room without oxygen.  Sats remained 94-97%.  Patient does not meet requirements for home O2

## 2021-09-10 ENCOUNTER — Telehealth (HOSPITAL_COMMUNITY): Payer: Self-pay

## 2021-09-10 NOTE — Telephone Encounter (Signed)
Per phase I cardiac rehab, fax cardiac rehab referral to [facility]. °

## 2021-09-11 ENCOUNTER — Other Ambulatory Visit: Payer: Self-pay

## 2021-09-11 ENCOUNTER — Ambulatory Visit (INDEPENDENT_AMBULATORY_CARE_PROVIDER_SITE_OTHER): Payer: Medicare Other

## 2021-09-11 DIAGNOSIS — G720 Drug-induced myopathy: Secondary | ICD-10-CM | POA: Diagnosis not present

## 2021-09-11 DIAGNOSIS — I2510 Atherosclerotic heart disease of native coronary artery without angina pectoris: Secondary | ICD-10-CM

## 2021-09-11 DIAGNOSIS — I10 Essential (primary) hypertension: Secondary | ICD-10-CM | POA: Diagnosis not present

## 2021-09-11 DIAGNOSIS — T466X5D Adverse effect of antihyperlipidemic and antiarteriosclerotic drugs, subsequent encounter: Secondary | ICD-10-CM | POA: Diagnosis not present

## 2021-09-11 LAB — ECHOCARDIOGRAM COMPLETE
Area-P 1/2: 6.96 cm2
Calc EF: 39.4 %
S' Lateral: 3.3 cm
Single Plane A2C EF: 40.8 %
Single Plane A4C EF: 40.8 %

## 2021-09-11 MED ORDER — PERFLUTREN LIPID MICROSPHERE
1.0000 mL | INTRAVENOUS | Status: AC | PRN
  Administered 2021-09-11: 8 mL via INTRAVENOUS

## 2021-09-13 ENCOUNTER — Telehealth (HOSPITAL_COMMUNITY): Payer: Self-pay | Admitting: Pharmacist

## 2021-09-13 ENCOUNTER — Other Ambulatory Visit (HOSPITAL_COMMUNITY): Payer: Self-pay

## 2021-09-20 ENCOUNTER — Other Ambulatory Visit: Payer: Self-pay

## 2021-09-20 ENCOUNTER — Ambulatory Visit: Payer: Medicare Other | Admitting: Pharmacist

## 2021-09-20 DIAGNOSIS — G720 Drug-induced myopathy: Secondary | ICD-10-CM

## 2021-09-20 DIAGNOSIS — E782 Mixed hyperlipidemia: Secondary | ICD-10-CM | POA: Diagnosis not present

## 2021-09-20 DIAGNOSIS — T466X5A Adverse effect of antihyperlipidemic and antiarteriosclerotic drugs, initial encounter: Secondary | ICD-10-CM

## 2021-09-20 DIAGNOSIS — I2510 Atherosclerotic heart disease of native coronary artery without angina pectoris: Secondary | ICD-10-CM | POA: Diagnosis not present

## 2021-09-20 MED ORDER — REPATHA SURECLICK 140 MG/ML ~~LOC~~ SOAJ: 1.0000 "pen " | SUBCUTANEOUS | 11 refills | Status: DC

## 2021-09-20 NOTE — Progress Notes (Signed)
Patient ID: Lance Shepherd                 DOB: 03-25-1946                    MRN: [id number]     HPI: Lance Shepherd is a 76 y.o. male patient referred to lipid clinic by Dr Bing Matter. PMH is significant for CAD s/p PTCA and stenting of RCA and circ in 07/2015, stenting to obtuse marg branch in 12/2017, DM, HLD, HTN, and COPD. Most recently, presented with COVID on 08/29/21, found to have NSTEMI 09/01/21. Cath 09/03/21 showed total occlusion of large OM 1 branch treated with PCI and DES, also with moderate RCA stenosis and moderate diffuse prox LAD stenosis. He was referred to lipid clinic due to history of statin intolerance.  Pt presents today for follow up. He is very hard of hearing, had to shout very loudly during visit to be heard. He was previously seen in lipid clinic 08/2020. At that time, pt had only tried rosuvastatin in the past which he reported extreme fatigue with. He was started on pravastatin 20mg  daily which he reported tolerating well but didn't want to increase his dose. He was then started on Repatha 140mg  Q2W. Pt today reports not taking either of these medications. Unclear why. Thinks he felt fatigued on pravastatin but doesn't know why he stopped Repatha. Still has some med in his fridge at home. I had enrolled him in a grant so his copay was free. Unclear documentation as to why he stopped meds, both meds removed from his med list 08/23/21 cards visit by CMA but not mentioned in notes.  Current Medications: none Intolerances: pravastatin 20mg  daily, rosuvastatin - fatigue Risk Factors: progressive ASCVD, DM, HTN, age LDL goal: 55mg /dL  Diet: not picky, likes everything, avoids pork, less of an appetite since he got sick  Exercise: Signed up for cardiac rehab  Family History: COPD in his mother; Dementia in his mother; Lung cancer in his father.  Social History: Denies tobacco, alcohol, and drug use.  Labs: 09/13/19: TC 195, TG 156, HDL 35, LDL 138 - no LLT 09/27/20: TC 167,  TG 149, HDL 33, LDL 107 - pravastatin 20mg  daily 01/24/21: TC 172, TG 149, HDL 38, LDL 108 - supposed to be taking pravastatin 20mg  daily and Repatha 140mg  Q2W, no documentation as to why not 09/05/21: TC 153, TG 91, HDL 27, LDL 108 - checked at time of MI, not on LLT  Past Medical History:  Diagnosis Date   Acute neck pain 12/21/2019   Allergic rhinitis 11/16/2018   Behavior disturbance 04/10/2020   Benign prostatic hyperplasia without lower urinary tract symptoms 10/09/2015   Cervical discitis 01/01/2020   Chronic obstructive pulmonary disease (HCC) 10/09/2015   Chronic pain of left knee 06/16/2017   Constipation, chronic 04/10/2020   Coronary arteriosclerosis in native artery 08/07/2016   Coronary artery disease stent to RCA and circumflex in November 2017, stent to obtuse marginal branch in 2019 08/07/2016   Diabetes mellitus (HCC) 01/19/2020   Diabetes mellitus type 2 in obese (HCC) 09/13/2019   HTN (hypertension) 01/19/2020   Hyperlipidemia 10/09/2015   Hypertensive heart disease with heart failure (HCC) 10/09/2015   Long-term insulin use (HCC) 08/12/2018   MCI (mild cognitive impairment) 09/13/2019   Medication monitoring encounter 03/01/2020   Mild anxiety 03/14/2016   NSTEMI, initial episode of care [facility]) 12/25/2017   Formatting of this note might be different from the original. 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 C5-6   Primary osteoarthritis of right knee 06/23/2017   Sinus tachycardia by electrocardiogram 11/11/2019   Spondylosis of cervical region without myelopathy or radiculopathy 12/21/2019   Statin intolerance 09/13/2019   Statin myopathy 08/20/2020   Status post coronary artery stent placement 01/19/2018    Current Outpatient Medications on File Prior to Visit  Medication  Sig Dispense Refill   aspirin 81 MG EC tablet Take 1 tablet (81 mg total) by mouth daily. Swallow whole. 30 tablet 11   benzonatate (TESSALON) 100 MG capsule Take 100 mg by mouth 3 (three) times daily as needed.     clopidogrel (PLAVIX) 75 MG tablet Take 1 tablet (75 mg total) by mouth daily with breakfast. 30 tablet 3   fluticasone (FLONASE) 50 MCG/ACT nasal spray Place 1 spray into both nostrils daily.     Fluticasone-Salmeterol (ADVAIR) 250-50 MCG/DOSE AEPB Inhale 1 puff into the lungs as needed for shortness of breath.     furosemide (LASIX) 20 MG tablet Take 20 mg by mouth as needed for fluid or edema.     gabapentin (NEURONTIN) 300 MG capsule Take 300 mg by mouth 3 (three) times daily.     insulin NPH-regular Human (70-30) 100 UNIT/ML injection Inject 100 Units into the skin 2 (two) times daily with a meal.     losartan (COZAAR) 50 MG tablet Take 1 tablet (50 mg total) by mouth daily. 30 tablet 3   metoprolol tartrate (LOPRESSOR) 50 MG tablet Take 1 tablet (50 mg total) by mouth 2 (two) times daily. 30 tablet 3   nitroGLYCERIN (NITROSTAT) 0.4 MG SL tablet Place 1 tablet (0.4 mg total) under the tongue every 5 (five) minutes as needed for chest pain. 15 tablet 1   tamsulosin (FLOMAX) 0.4 MG CAPS capsule Take 0.4 mg by mouth daily.     No current facility-administered medications on file prior to visit.    Allergies  Allergen Reactions   Ticagrelor Shortness Of Breath   Pioglitazone Diarrhea   Statins Nausea Only and Rash    Assessment/Plan:  1. Hyperlipidemia - LDL 108 at time of MI in December, above goal < 55 given progressive ASCVD. He is intolerant to pravastatin and rosuvastatin (fatigue with both) and does not know why he stopped taking Repatha. This was previously prescribed by lipid clinic a year ago and pt was enrolled in grant to make rx free too. Discussed need to resume Repatha 140mg  Q2W which he will do. I reactivated his grant so his copay will remain $0. He sees Dr  in March and can have lipids rechecked then.  Misa Fedorko E. Danicia Terhaar, PharmD, BCACP, CPP [facility] [address] [place], [place] Phone: [phone]; Fax: [phone] 09/20/2021 4:07 PM

## 2021-09-20 NOTE — Patient Instructions (Addendum)
It was nice to meet you today  Your LDL cholesterol is 108 and your goal is < 55  Restart your Repatha 1 injection in the fatty tissue of your stomach every 2 weeks  Dr Bing Matter can recheck your cholesterol in a few months

## 2021-09-24 DIAGNOSIS — Z955 Presence of coronary angioplasty implant and graft: Secondary | ICD-10-CM | POA: Diagnosis not present

## 2021-09-24 DIAGNOSIS — E119 Type 2 diabetes mellitus without complications: Secondary | ICD-10-CM | POA: Diagnosis not present

## 2021-09-24 DIAGNOSIS — I252 Old myocardial infarction: Secondary | ICD-10-CM | POA: Diagnosis not present

## 2021-09-24 DIAGNOSIS — I110 Hypertensive heart disease with heart failure: Secondary | ICD-10-CM | POA: Diagnosis not present

## 2021-09-24 DIAGNOSIS — I509 Heart failure, unspecified: Secondary | ICD-10-CM | POA: Diagnosis not present

## 2021-09-24 DIAGNOSIS — E785 Hyperlipidemia, unspecified: Secondary | ICD-10-CM | POA: Diagnosis not present

## 2021-09-25 DIAGNOSIS — I252 Old myocardial infarction: Secondary | ICD-10-CM | POA: Diagnosis not present

## 2021-09-25 DIAGNOSIS — I509 Heart failure, unspecified: Secondary | ICD-10-CM | POA: Diagnosis not present

## 2021-09-25 DIAGNOSIS — Z955 Presence of coronary angioplasty implant and graft: Secondary | ICD-10-CM | POA: Diagnosis not present

## 2021-09-25 DIAGNOSIS — I110 Hypertensive heart disease with heart failure: Secondary | ICD-10-CM | POA: Diagnosis not present

## 2021-09-25 DIAGNOSIS — E785 Hyperlipidemia, unspecified: Secondary | ICD-10-CM | POA: Diagnosis not present

## 2021-09-25 DIAGNOSIS — E119 Type 2 diabetes mellitus without complications: Secondary | ICD-10-CM | POA: Diagnosis not present

## 2021-09-27 DIAGNOSIS — E119 Type 2 diabetes mellitus without complications: Secondary | ICD-10-CM | POA: Diagnosis not present

## 2021-09-27 DIAGNOSIS — I509 Heart failure, unspecified: Secondary | ICD-10-CM | POA: Diagnosis not present

## 2021-09-27 DIAGNOSIS — I252 Old myocardial infarction: Secondary | ICD-10-CM | POA: Diagnosis not present

## 2021-09-27 DIAGNOSIS — Z955 Presence of coronary angioplasty implant and graft: Secondary | ICD-10-CM | POA: Diagnosis not present

## 2021-09-27 DIAGNOSIS — E785 Hyperlipidemia, unspecified: Secondary | ICD-10-CM | POA: Diagnosis not present

## 2021-09-27 DIAGNOSIS — I110 Hypertensive heart disease with heart failure: Secondary | ICD-10-CM | POA: Diagnosis not present

## 2021-09-30 DIAGNOSIS — I110 Hypertensive heart disease with heart failure: Secondary | ICD-10-CM | POA: Diagnosis not present

## 2021-09-30 DIAGNOSIS — I509 Heart failure, unspecified: Secondary | ICD-10-CM | POA: Diagnosis not present

## 2021-09-30 DIAGNOSIS — E785 Hyperlipidemia, unspecified: Secondary | ICD-10-CM | POA: Diagnosis not present

## 2021-09-30 DIAGNOSIS — I252 Old myocardial infarction: Secondary | ICD-10-CM | POA: Diagnosis not present

## 2021-09-30 DIAGNOSIS — Z955 Presence of coronary angioplasty implant and graft: Secondary | ICD-10-CM | POA: Diagnosis not present

## 2021-09-30 DIAGNOSIS — E119 Type 2 diabetes mellitus without complications: Secondary | ICD-10-CM | POA: Diagnosis not present

## 2021-10-01 ENCOUNTER — Telehealth: Payer: Self-pay

## 2021-10-01 NOTE — Telephone Encounter (Signed)
[id number]  Prior auth or Repatha approved.

## 2021-10-02 DIAGNOSIS — Z0000 Encounter for general adult medical examination without abnormal findings: Secondary | ICD-10-CM | POA: Diagnosis not present

## 2021-10-02 DIAGNOSIS — E119 Type 2 diabetes mellitus without complications: Secondary | ICD-10-CM | POA: Diagnosis not present

## 2021-10-02 DIAGNOSIS — I509 Heart failure, unspecified: Secondary | ICD-10-CM | POA: Diagnosis not present

## 2021-10-02 DIAGNOSIS — E1165 Type 2 diabetes mellitus with hyperglycemia: Secondary | ICD-10-CM | POA: Diagnosis not present

## 2021-10-02 DIAGNOSIS — I110 Hypertensive heart disease with heart failure: Secondary | ICD-10-CM | POA: Diagnosis not present

## 2021-10-02 DIAGNOSIS — I252 Old myocardial infarction: Secondary | ICD-10-CM | POA: Diagnosis not present

## 2021-10-02 DIAGNOSIS — I25119 Atherosclerotic heart disease of native coronary artery with unspecified angina pectoris: Secondary | ICD-10-CM | POA: Diagnosis not present

## 2021-10-02 DIAGNOSIS — Z955 Presence of coronary angioplasty implant and graft: Secondary | ICD-10-CM | POA: Diagnosis not present

## 2021-10-02 DIAGNOSIS — E785 Hyperlipidemia, unspecified: Secondary | ICD-10-CM | POA: Diagnosis not present

## 2021-10-04 DIAGNOSIS — E785 Hyperlipidemia, unspecified: Secondary | ICD-10-CM | POA: Diagnosis not present

## 2021-10-04 DIAGNOSIS — I110 Hypertensive heart disease with heart failure: Secondary | ICD-10-CM | POA: Diagnosis not present

## 2021-10-04 DIAGNOSIS — E119 Type 2 diabetes mellitus without complications: Secondary | ICD-10-CM | POA: Diagnosis not present

## 2021-10-04 DIAGNOSIS — I252 Old myocardial infarction: Secondary | ICD-10-CM | POA: Diagnosis not present

## 2021-10-04 DIAGNOSIS — I509 Heart failure, unspecified: Secondary | ICD-10-CM | POA: Diagnosis not present

## 2021-10-04 DIAGNOSIS — Z955 Presence of coronary angioplasty implant and graft: Secondary | ICD-10-CM | POA: Diagnosis not present

## 2021-10-07 DIAGNOSIS — I110 Hypertensive heart disease with heart failure: Secondary | ICD-10-CM | POA: Diagnosis not present

## 2021-10-07 DIAGNOSIS — E119 Type 2 diabetes mellitus without complications: Secondary | ICD-10-CM | POA: Diagnosis not present

## 2021-10-07 DIAGNOSIS — Z955 Presence of coronary angioplasty implant and graft: Secondary | ICD-10-CM | POA: Diagnosis not present

## 2021-10-07 DIAGNOSIS — I509 Heart failure, unspecified: Secondary | ICD-10-CM | POA: Diagnosis not present

## 2021-10-07 DIAGNOSIS — I252 Old myocardial infarction: Secondary | ICD-10-CM | POA: Diagnosis not present

## 2021-10-07 DIAGNOSIS — E785 Hyperlipidemia, unspecified: Secondary | ICD-10-CM | POA: Diagnosis not present

## 2021-10-09 DIAGNOSIS — Z955 Presence of coronary angioplasty implant and graft: Secondary | ICD-10-CM | POA: Diagnosis not present

## 2021-10-09 DIAGNOSIS — I252 Old myocardial infarction: Secondary | ICD-10-CM | POA: Diagnosis not present

## 2021-10-11 DIAGNOSIS — Z955 Presence of coronary angioplasty implant and graft: Secondary | ICD-10-CM | POA: Diagnosis not present

## 2021-10-11 DIAGNOSIS — I252 Old myocardial infarction: Secondary | ICD-10-CM | POA: Diagnosis not present

## 2021-10-14 DIAGNOSIS — Z955 Presence of coronary angioplasty implant and graft: Secondary | ICD-10-CM | POA: Diagnosis not present

## 2021-10-14 DIAGNOSIS — I252 Old myocardial infarction: Secondary | ICD-10-CM | POA: Diagnosis not present

## 2021-10-16 DIAGNOSIS — R5383 Other fatigue: Secondary | ICD-10-CM | POA: Diagnosis not present

## 2021-10-16 DIAGNOSIS — Z87891 Personal history of nicotine dependence: Secondary | ICD-10-CM | POA: Diagnosis not present

## 2021-10-16 DIAGNOSIS — J4540 Moderate persistent asthma, uncomplicated: Secondary | ICD-10-CM | POA: Diagnosis not present

## 2021-10-16 DIAGNOSIS — I252 Old myocardial infarction: Secondary | ICD-10-CM | POA: Diagnosis not present

## 2021-10-16 DIAGNOSIS — G4733 Obstructive sleep apnea (adult) (pediatric): Secondary | ICD-10-CM | POA: Diagnosis not present

## 2021-10-16 DIAGNOSIS — Z955 Presence of coronary angioplasty implant and graft: Secondary | ICD-10-CM | POA: Diagnosis not present

## 2021-10-18 DIAGNOSIS — I252 Old myocardial infarction: Secondary | ICD-10-CM | POA: Diagnosis not present

## 2021-10-18 DIAGNOSIS — J4540 Moderate persistent asthma, uncomplicated: Secondary | ICD-10-CM | POA: Diagnosis not present

## 2021-10-18 DIAGNOSIS — Z955 Presence of coronary angioplasty implant and graft: Secondary | ICD-10-CM | POA: Diagnosis not present

## 2021-10-21 DIAGNOSIS — I252 Old myocardial infarction: Secondary | ICD-10-CM | POA: Diagnosis not present

## 2021-10-21 DIAGNOSIS — Z955 Presence of coronary angioplasty implant and graft: Secondary | ICD-10-CM | POA: Diagnosis not present

## 2021-10-23 DIAGNOSIS — Z955 Presence of coronary angioplasty implant and graft: Secondary | ICD-10-CM | POA: Diagnosis not present

## 2021-10-23 DIAGNOSIS — I252 Old myocardial infarction: Secondary | ICD-10-CM | POA: Diagnosis not present

## 2021-10-25 DIAGNOSIS — I252 Old myocardial infarction: Secondary | ICD-10-CM | POA: Diagnosis not present

## 2021-10-25 DIAGNOSIS — Z955 Presence of coronary angioplasty implant and graft: Secondary | ICD-10-CM | POA: Diagnosis not present

## 2021-10-28 DIAGNOSIS — Z955 Presence of coronary angioplasty implant and graft: Secondary | ICD-10-CM | POA: Diagnosis not present

## 2021-10-28 DIAGNOSIS — I252 Old myocardial infarction: Secondary | ICD-10-CM | POA: Diagnosis not present

## 2021-10-30 DIAGNOSIS — Z87891 Personal history of nicotine dependence: Secondary | ICD-10-CM | POA: Diagnosis not present

## 2021-10-30 DIAGNOSIS — R5383 Other fatigue: Secondary | ICD-10-CM | POA: Diagnosis not present

## 2021-10-30 DIAGNOSIS — G4733 Obstructive sleep apnea (adult) (pediatric): Secondary | ICD-10-CM | POA: Diagnosis not present

## 2021-10-30 DIAGNOSIS — I252 Old myocardial infarction: Secondary | ICD-10-CM | POA: Diagnosis not present

## 2021-10-30 DIAGNOSIS — J4540 Moderate persistent asthma, uncomplicated: Secondary | ICD-10-CM | POA: Diagnosis not present

## 2021-10-30 DIAGNOSIS — Z955 Presence of coronary angioplasty implant and graft: Secondary | ICD-10-CM | POA: Diagnosis not present

## 2021-11-01 DIAGNOSIS — Z955 Presence of coronary angioplasty implant and graft: Secondary | ICD-10-CM | POA: Diagnosis not present

## 2021-11-01 DIAGNOSIS — I252 Old myocardial infarction: Secondary | ICD-10-CM | POA: Diagnosis not present

## 2021-11-04 DIAGNOSIS — I252 Old myocardial infarction: Secondary | ICD-10-CM | POA: Diagnosis not present

## 2021-11-04 DIAGNOSIS — Z955 Presence of coronary angioplasty implant and graft: Secondary | ICD-10-CM | POA: Diagnosis not present

## 2021-11-06 DIAGNOSIS — Z955 Presence of coronary angioplasty implant and graft: Secondary | ICD-10-CM | POA: Diagnosis not present

## 2021-11-06 DIAGNOSIS — I252 Old myocardial infarction: Secondary | ICD-10-CM | POA: Diagnosis not present

## 2021-11-08 DIAGNOSIS — Z955 Presence of coronary angioplasty implant and graft: Secondary | ICD-10-CM | POA: Diagnosis not present

## 2021-11-08 DIAGNOSIS — I252 Old myocardial infarction: Secondary | ICD-10-CM | POA: Diagnosis not present

## 2021-11-10 DIAGNOSIS — G4733 Obstructive sleep apnea (adult) (pediatric): Secondary | ICD-10-CM | POA: Diagnosis not present

## 2021-11-11 DIAGNOSIS — Z955 Presence of coronary angioplasty implant and graft: Secondary | ICD-10-CM | POA: Diagnosis not present

## 2021-11-11 DIAGNOSIS — I252 Old myocardial infarction: Secondary | ICD-10-CM | POA: Diagnosis not present

## 2021-11-13 DIAGNOSIS — I252 Old myocardial infarction: Secondary | ICD-10-CM | POA: Diagnosis not present

## 2021-11-13 DIAGNOSIS — Z955 Presence of coronary angioplasty implant and graft: Secondary | ICD-10-CM | POA: Diagnosis not present

## 2021-11-15 DIAGNOSIS — I252 Old myocardial infarction: Secondary | ICD-10-CM | POA: Diagnosis not present

## 2021-11-15 DIAGNOSIS — Z955 Presence of coronary angioplasty implant and graft: Secondary | ICD-10-CM | POA: Diagnosis not present

## 2021-11-18 DIAGNOSIS — R5383 Other fatigue: Secondary | ICD-10-CM | POA: Diagnosis not present

## 2021-11-18 DIAGNOSIS — Z955 Presence of coronary angioplasty implant and graft: Secondary | ICD-10-CM | POA: Diagnosis not present

## 2021-11-18 DIAGNOSIS — I252 Old myocardial infarction: Secondary | ICD-10-CM | POA: Diagnosis not present

## 2021-11-18 DIAGNOSIS — Z87891 Personal history of nicotine dependence: Secondary | ICD-10-CM | POA: Diagnosis not present

## 2021-11-18 DIAGNOSIS — G4733 Obstructive sleep apnea (adult) (pediatric): Secondary | ICD-10-CM | POA: Diagnosis not present

## 2021-11-18 DIAGNOSIS — J4540 Moderate persistent asthma, uncomplicated: Secondary | ICD-10-CM | POA: Diagnosis not present

## 2021-11-20 DIAGNOSIS — I252 Old myocardial infarction: Secondary | ICD-10-CM | POA: Diagnosis not present

## 2021-11-20 DIAGNOSIS — Z955 Presence of coronary angioplasty implant and graft: Secondary | ICD-10-CM | POA: Diagnosis not present

## 2021-11-21 ENCOUNTER — Encounter: Payer: Self-pay | Admitting: Cardiology

## 2021-11-21 ENCOUNTER — Ambulatory Visit: Payer: Medicare Other | Admitting: Cardiology

## 2021-11-21 ENCOUNTER — Other Ambulatory Visit: Payer: Self-pay

## 2021-11-21 VITALS — BP 126/80 | HR 73 | Ht 71.0 in | Wt 250.8 lb

## 2021-11-21 DIAGNOSIS — T466X5D Adverse effect of antihyperlipidemic and antiarteriosclerotic drugs, subsequent encounter: Secondary | ICD-10-CM

## 2021-11-21 DIAGNOSIS — I25110 Atherosclerotic heart disease of native coronary artery with unstable angina pectoris: Secondary | ICD-10-CM

## 2021-11-21 DIAGNOSIS — G720 Drug-induced myopathy: Secondary | ICD-10-CM | POA: Diagnosis not present

## 2021-11-21 DIAGNOSIS — I10 Essential (primary) hypertension: Secondary | ICD-10-CM

## 2021-11-21 NOTE — Patient Instructions (Signed)

## 2021-11-21 NOTE — Progress Notes (Signed)
?Cardiology Office Note:   ? ?Date:  11/21/2021  ? ?ID:  Lance Shepherd, DOB [birth year], MRN [id number] ? ?PCP:  [name], MD  ?Cardiologist:  Gypsy Balsam, MD   ? ?Referring MD: [name], MD  ? ?Chief Complaint  ?Patient presents with  ? Shortness of Breath  ? Weakness  ? ? ?History of Present Illness:   ? ?Lance Shepherd is a 76 y.o. male with past medical history significant for coronary artery disease, status post PTCA and stenting of the right coronary artery and circumflex done in November 2016, then later stenting of obtuse marginal branch done in April 2019, diabetes, hyperlipidemia, hypertension, COPD.  Most recently in August 29, 2021 he presented to the hospital with COVID after being evaluated in the emergency started complaining of having chest pain.  He was find to have non-STEMI cardiac catheterization performed on 27 December showed 99% stenosis of large obtuse marginal branch treated with PTCA and stenting with drug-eluting stent.  Also he was noted to have moderate RCA stenosis as well as moderate diffuse proximal LAD stenosis.  He was sent back to [place].  Interestingly look like he did stop his PCSK9 agent.  In the meantime in January 13 he is being seen by lipid clinic and the recommendation was to restart Repatha.  I am not sure exactly how he discontinued that medication. ?Overall he is doing very well.  But still complain of being weak tired exhausted likely he participate in rehab.  Complain also of having some fatigue tiredness and shortness of breath.  He did have quite extensive evaluation done by his pulmonologist apparently nothing permanent has been identified. ? ?Past Medical History:  ?Diagnosis Date  ? Acute neck pain 12/21/2019  ? Allergic rhinitis 11/16/2018  ? Behavior disturbance 04/10/2020  ? Benign prostatic hyperplasia without lower urinary tract symptoms 10/09/2015  ? Cervical discitis 01/01/2020  ? Chronic obstructive pulmonary disease (HCC) 10/09/2015  ? Chronic pain of  left knee 06/16/2017  ? Constipation, chronic 04/10/2020  ? Coronary arteriosclerosis in native artery 08/07/2016  ? Coronary artery disease stent to RCA and circumflex in November 2017, stent to obtuse marginal branch in 2019 08/07/2016  ? Diabetes mellitus (HCC) 01/19/2020  ? Diabetes mellitus type 2 in obese (HCC) 09/13/2019  ? HTN (hypertension) 01/19/2020  ? Hyperlipidemia 10/09/2015  ? Hypertensive heart disease with heart failure (HCC) 10/09/2015  ? Long-term insulin use (HCC) 08/12/2018  ? MCI (mild cognitive impairment) 09/13/2019  ? Medication monitoring encounter 03/01/2020  ? Mild anxiety 03/14/2016  ? NSTEMI, initial episode of care [facility]) 12/25/2017  ? Formatting of this note might be different from the original. Added automatically from request for surgery [id number]  ? Obesity due to excess calories 12/25/2017  ? Obstructive sleep apnea syndrome 02/19/2016  ? 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 Formatting of this note might be different from the original. C5-6  ? Primary osteoarthritis of right knee 06/23/2017  ? Sinus tachycardia by electrocardiogram 11/11/2019  ? Spondylosis of cervical region without myelopathy or radiculopathy 12/21/2019  ? Statin intolerance 09/13/2019  ? Statin myopathy 08/20/2020  ? Status post coronary artery stent placement 01/19/2018  ? ? ?Past Surgical History:  ?Procedure Laterality Date  ? cardiacstent placement    ? COLONOSCOPY    ? CORONARY ANGIOGRAPHY N/A 09/03/2021  ? Procedure: CORONARY ANGIOGRAPHY;  Surgeon: Tonny Bollman, MD;  Location: [facility] INVASIVE CV LAB;  Service: Cardiovascular;  Laterality: N/A;  ? CORONARY STENT INTERVENTION  N/A 09/03/2021  ? Procedure: CORONARY STENT INTERVENTION;  Surgeon: Tonny Bollman, MD;  Location: [facility] INVASIVE CV LAB;  Service: Cardiovascular;  Laterality: N/A;  ? hernia repaired    ? stent replacement    ? ? ?Current Medications: ?Current Meds  ?Medication Sig  ? aspirin 81 MG EC tablet Take 1 tablet (81 mg total) by mouth daily. Swallow whole.   ? benzonatate (TESSALON) 100 MG capsule Take 100 mg by mouth 3 (three) times daily as needed.  ? clopidogrel (PLAVIX) 75 MG tablet Take 1 tablet (75 mg total) by mouth daily with breakfast.  ? Evolocumab (REPATHA SURECLICK) 140 MG/ML SOAJ Inject 1 pen into the skin every 14 (fourteen) days.  ? fluticasone (FLONASE) 50 MCG/ACT nasal spray Place 1 spray into both nostrils daily.  ? Fluticasone-Salmeterol (ADVAIR) 250-50 MCG/DOSE AEPB Inhale 1 puff into the lungs as needed for shortness of breath.  ? furosemide (LASIX) 20 MG tablet Take 20 mg by mouth as needed for fluid or edema.  ? insulin NPH-regular Human (70-30) 100 UNIT/ML injection Inject 100 Units into the skin 2 (two) times daily with a meal.  ? losartan (COZAAR) 50 MG tablet Take 1 tablet (50 mg total) by mouth daily.  ? metoprolol tartrate (LOPRESSOR) 50 MG tablet Take 1 tablet (50 mg total) by mouth 2 (two) times daily.  ? nitroGLYCERIN (NITROSTAT) 0.4 MG SL tablet Place 1 tablet (0.4 mg total) under the tongue every 5 (five) minutes as needed for chest pain.  ? tamsulosin (FLOMAX) 0.4 MG CAPS capsule Take 0.4 mg by mouth daily.  ?  ? ?Allergies:   Ticagrelor, Pioglitazone, and Statins  ? ?Social History  ? ?Socioeconomic History  ? Marital status: Married  ?  Spouse name: Not on file  ? Number of children: Not on file  ? Years of education: Not on file  ? Highest education level: Not on file  ?Occupational History  ? Not on file  ?Tobacco Use  ? Smoking status: Never  ? Smokeless tobacco: Never  ?Substance and Sexual Activity  ? Alcohol use: Never  ? Drug use: Never  ? Sexual activity: Not Currently  ?Other Topics Concern  ? Not on file  ?Social History Narrative  ? Not on file  ? ?Social Determinants of Health  ? ?Financial Resource Strain: Not on file  ?Food Insecurity: Not on file  ?Transportation Needs: Not on file  ?Physical Activity: Not on file  ?Stress: Not on file  ?Social Connections: Not on file  ?  ? ?Family History: ?The patient's family  history includes COPD in his mother; Dementia in his mother; Lung cancer in his father. ?ROS:   ?Please see the history of present illness.    ?All 14 point review of systems negative except as described per history of present illness ? ?EKGs/Labs/Other Studies Reviewed:   ? ? ? ?Recent Labs: ?09/02/2021: B Natriuretic Peptide 140.0 ?09/03/2021: ALT 31 ?09/04/2021: Hemoglobin 13.7; Platelets 169 ?09/05/2021: BUN 10; Creatinine, Ser 1.16; Potassium 4.7; Sodium 137  ?Recent Lipid Panel ?   ?Component Value Date/Time  ? CHOL 153 09/05/2021 0145  ? CHOL 172 01/24/2021 1339  ? TRIG 91 09/05/2021 0145  ? HDL 27 (L) 09/05/2021 0145  ? HDL 38 (L) 01/24/2021 1339  ? CHOLHDL 5.7 09/05/2021 0145  ? VLDL 18 09/05/2021 0145  ? LDLCALC 108 (H) 09/05/2021 0145  ? LDLCALC 108 (H) 01/24/2021 1339  ? ? ?Physical Exam:   ? ?VS:  BP 126/80 (BP Location: Left Arm,  Patient Position: Sitting)   Pulse 73   Ht 5\' 11"  (1.803 m)   Wt 250 lb 12.8 oz (113.8 kg)   SpO2 98%   BMI 34.98 kg/m?    ? ?Wt Readings from Last 3 Encounters:  ?11/21/21 250 lb 12.8 oz (113.8 kg)  ?09/01/21 250 lb 14.1 oz (113.8 kg)  ?08/23/21 254 lb 6.4 oz (115.4 kg)  ?  ? ?GEN:  Well nourished, well developed in no acute distress ?HEENT: Normal ?NECK: No JVD; No carotid bruits ?LYMPHATICS: No lymphadenopathy ?CARDIAC: RRR, no murmurs, no rubs, no gallops ?RESPIRATORY:  Clear to auscultation without rales, wheezing or rhonchi  ?ABDOMEN: Soft, non-tender, non-distended ?MUSCULOSKELETAL:  No edema; No deformity  ?SKIN: Warm and dry ?LOWER EXTREMITIES: no swelling ?NEUROLOGIC:  Alert and oriented x 3 ?PSYCHIATRIC:  Normal affect  ? ?ASSESSMENT:   ? ?1. Coronary artery disease involving native heart with unstable angina pectoris, unspecified vessel or lesion type (HCC)   ?2. Primary hypertension   ?3. Statin myopathy   ? ?PLAN:   ? ?In order of problems listed above: ? ?Coronary artery disease admission data reviewed with the patient.  He is doing better but still not  perfect.  We will ask him to continue dual antiplatelet therapy.  I asked him to continue continue participating in rehab. ?Essential hypertension: Blood pressure well controlled continue present management. ?Dysl

## 2021-11-22 DIAGNOSIS — I252 Old myocardial infarction: Secondary | ICD-10-CM | POA: Diagnosis not present

## 2021-11-22 DIAGNOSIS — Z955 Presence of coronary angioplasty implant and graft: Secondary | ICD-10-CM | POA: Diagnosis not present

## 2021-11-22 LAB — LIPID PANEL
Chol/HDL Ratio: 2.6 ratio (ref 0.0–5.0)
Cholesterol, Total: 103 mg/dL (ref 100–199)
HDL: 39 mg/dL — ABNORMAL LOW (ref >39)
LDL Chol Calc (NIH): 40 mg/dL (ref 0–99)
Triglycerides: 138 mg/dL (ref 0–149)
VLDL Cholesterol Cal: 24 mg/dL (ref 5–40)

## 2021-11-25 DIAGNOSIS — Z955 Presence of coronary angioplasty implant and graft: Secondary | ICD-10-CM | POA: Diagnosis not present

## 2021-11-25 DIAGNOSIS — I252 Old myocardial infarction: Secondary | ICD-10-CM | POA: Diagnosis not present

## 2021-11-27 DIAGNOSIS — Z955 Presence of coronary angioplasty implant and graft: Secondary | ICD-10-CM | POA: Diagnosis not present

## 2021-11-27 DIAGNOSIS — I252 Old myocardial infarction: Secondary | ICD-10-CM | POA: Diagnosis not present

## 2021-11-29 DIAGNOSIS — Z955 Presence of coronary angioplasty implant and graft: Secondary | ICD-10-CM | POA: Diagnosis not present

## 2021-11-29 DIAGNOSIS — I252 Old myocardial infarction: Secondary | ICD-10-CM | POA: Diagnosis not present

## 2021-12-02 DIAGNOSIS — I252 Old myocardial infarction: Secondary | ICD-10-CM | POA: Diagnosis not present

## 2021-12-02 DIAGNOSIS — Z955 Presence of coronary angioplasty implant and graft: Secondary | ICD-10-CM | POA: Diagnosis not present

## 2021-12-04 DIAGNOSIS — Z955 Presence of coronary angioplasty implant and graft: Secondary | ICD-10-CM | POA: Diagnosis not present

## 2021-12-04 DIAGNOSIS — I252 Old myocardial infarction: Secondary | ICD-10-CM | POA: Diagnosis not present

## 2021-12-05 DIAGNOSIS — Z955 Presence of coronary angioplasty implant and graft: Secondary | ICD-10-CM | POA: Diagnosis not present

## 2021-12-05 DIAGNOSIS — I252 Old myocardial infarction: Secondary | ICD-10-CM | POA: Diagnosis not present

## 2021-12-05 DIAGNOSIS — M170 Bilateral primary osteoarthritis of knee: Secondary | ICD-10-CM | POA: Diagnosis not present

## 2021-12-06 ENCOUNTER — Telehealth: Payer: Self-pay | Admitting: Cardiology

## 2021-12-06 NOTE — Telephone Encounter (Signed)
Patient informed of results.  

## 2021-12-06 NOTE — Telephone Encounter (Signed)
° °  Pt is returning call to get lab result °

## 2021-12-09 DIAGNOSIS — I252 Old myocardial infarction: Secondary | ICD-10-CM | POA: Diagnosis not present

## 2021-12-09 DIAGNOSIS — Z955 Presence of coronary angioplasty implant and graft: Secondary | ICD-10-CM | POA: Diagnosis not present

## 2021-12-10 DIAGNOSIS — I252 Old myocardial infarction: Secondary | ICD-10-CM | POA: Diagnosis not present

## 2021-12-10 DIAGNOSIS — Z955 Presence of coronary angioplasty implant and graft: Secondary | ICD-10-CM | POA: Diagnosis not present

## 2021-12-11 DIAGNOSIS — Z955 Presence of coronary angioplasty implant and graft: Secondary | ICD-10-CM | POA: Diagnosis not present

## 2021-12-11 DIAGNOSIS — I252 Old myocardial infarction: Secondary | ICD-10-CM | POA: Diagnosis not present

## 2022-01-07 DIAGNOSIS — H25811 Combined forms of age-related cataract, right eye: Secondary | ICD-10-CM | POA: Diagnosis not present

## 2022-01-07 DIAGNOSIS — H25813 Combined forms of age-related cataract, bilateral: Secondary | ICD-10-CM | POA: Diagnosis not present

## 2022-01-07 DIAGNOSIS — Z794 Long term (current) use of insulin: Secondary | ICD-10-CM | POA: Diagnosis not present

## 2022-01-07 DIAGNOSIS — Z01818 Encounter for other preprocedural examination: Secondary | ICD-10-CM | POA: Diagnosis not present

## 2022-01-07 DIAGNOSIS — H40013 Open angle with borderline findings, low risk, bilateral: Secondary | ICD-10-CM | POA: Diagnosis not present

## 2022-01-07 DIAGNOSIS — E113293 Type 2 diabetes mellitus with mild nonproliferative diabetic retinopathy without macular edema, bilateral: Secondary | ICD-10-CM | POA: Diagnosis not present

## 2022-01-14 DIAGNOSIS — E1136 Type 2 diabetes mellitus with diabetic cataract: Secondary | ICD-10-CM | POA: Diagnosis not present

## 2022-01-14 DIAGNOSIS — H40003 Preglaucoma, unspecified, bilateral: Secondary | ICD-10-CM | POA: Diagnosis not present

## 2022-01-14 DIAGNOSIS — Z794 Long term (current) use of insulin: Secondary | ICD-10-CM | POA: Diagnosis not present

## 2022-01-14 DIAGNOSIS — I10 Essential (primary) hypertension: Secondary | ICD-10-CM | POA: Diagnosis not present

## 2022-01-14 DIAGNOSIS — H259 Unspecified age-related cataract: Secondary | ICD-10-CM | POA: Diagnosis not present

## 2022-01-14 DIAGNOSIS — E113293 Type 2 diabetes mellitus with mild nonproliferative diabetic retinopathy without macular edema, bilateral: Secondary | ICD-10-CM | POA: Diagnosis not present

## 2022-01-14 DIAGNOSIS — H25811 Combined forms of age-related cataract, right eye: Secondary | ICD-10-CM | POA: Diagnosis not present

## 2022-01-14 DIAGNOSIS — H40033 Anatomical narrow angle, bilateral: Secondary | ICD-10-CM | POA: Diagnosis not present

## 2022-01-30 DIAGNOSIS — E1165 Type 2 diabetes mellitus with hyperglycemia: Secondary | ICD-10-CM | POA: Diagnosis not present

## 2022-02-06 ENCOUNTER — Encounter: Payer: Self-pay | Admitting: Cardiology

## 2022-02-06 ENCOUNTER — Ambulatory Visit: Payer: Medicare Other | Admitting: Cardiology

## 2022-02-06 VITALS — BP 120/80 | HR 110 | Ht 71.0 in | Wt 250.0 lb

## 2022-02-06 DIAGNOSIS — I10 Essential (primary) hypertension: Secondary | ICD-10-CM | POA: Diagnosis not present

## 2022-02-06 DIAGNOSIS — E1169 Type 2 diabetes mellitus with other specified complication: Secondary | ICD-10-CM

## 2022-02-06 DIAGNOSIS — G4733 Obstructive sleep apnea (adult) (pediatric): Secondary | ICD-10-CM | POA: Diagnosis not present

## 2022-02-06 DIAGNOSIS — I25110 Atherosclerotic heart disease of native coronary artery with unstable angina pectoris: Secondary | ICD-10-CM

## 2022-02-06 DIAGNOSIS — R0609 Other forms of dyspnea: Secondary | ICD-10-CM | POA: Diagnosis not present

## 2022-02-06 DIAGNOSIS — E782 Mixed hyperlipidemia: Secondary | ICD-10-CM | POA: Diagnosis not present

## 2022-02-06 NOTE — Patient Instructions (Addendum)
Medication Instructions:  Your physician recommends that you continue on your current medications as directed. Please refer to the Current Medication list given to you today.  *If you need a refill on your cardiac medications before your next appointment, please call your pharmacy*   Lab Work: Pro BNP & BMP Today If you have labs (blood work) drawn today and your tests are completely normal, you will receive your results only by: MyChart Message (if you have MyChart) OR A paper copy in the mail If you have any lab test that is abnormal or we need to change your treatment, we will call you to review the results.   Testing/Procedures: None Ordered   Follow-Up: At [facility], you and your health needs are our priority.  As part of our continuing mission to provide you with exceptional heart care, we have created designated Provider Care Teams.  These Care Teams include your primary Cardiologist (physician) and Advanced Practice Providers (APPs -  Physician Assistants and Nurse Practitioners) who all work together to provide you with the care you need, when you need it.  We recommend signing up for the patient portal called "MyChart".  Sign up information is provided on this After Visit Summary.  MyChart is used to connect with patients for Virtual Visits (Telemedicine).  Patients are able to view lab/test results, encounter notes, upcoming appointments, etc.  Non-urgent messages can be sent to your provider as well.   To learn more about what you can do with MyChart, go to [email or web address].    Your next appointment:   5 month(s)  The format for your next appointment:   In Person  Provider:   Gypsy Balsam, MD    Other Instructions NA

## 2022-02-06 NOTE — Progress Notes (Signed)
Cardiology Office Note:    Date:  02/06/2022   ID:  Lance Shepherd, DOB July 10, 1946, MRN [id number]  PCP:  Ronita Hipps, MD  Cardiologist:  Jenne Campus, MD    Referring MD: Ronita Hipps, MD   No chief complaint on file. I am doing better but to have swelling of lower extremities  History of Present Illness:    Lance Shepherd is a 76 y.o. male  with past medical history significant for coronary artery disease, status post PTCA and stenting of the right coronary artery and circumflex done in November 2016, then later stenting of obtuse marginal branch done in April 2019, diabetes, hyperlipidemia, hypertension, COPD.  Most recently in August 29, 2021 he presented to the hospital with COVID after being evaluated in the emergency started complaining of having chest pain.  He was find to have non-STEMI cardiac catheterization performed on 27 December showed 99% stenosis of large obtuse marginal branch treated with PTCA and stenting with drug-eluting stent.  Also he was noted to have moderate RCA stenosis as well as moderate diffuse proximal LAD stenosis.  He was sent back to [place].   Comes today to my office for follow-up.  Overall he is doing fine he was noted to have swelling of lower extremities for which he was given diuretic Lasix 20 mg to take on as-needed basis by primary care physician that seems to be helping otherwise denies have any chest pain tightness squeezing pressure burning chest.  Past Medical History:  Diagnosis Date   Acute neck pain 12/21/2019   Allergic rhinitis 11/16/2018   Behavior disturbance 04/10/2020   Benign prostatic hyperplasia without lower urinary tract symptoms 10/09/2015   Cervical discitis 01/01/2020   Chronic obstructive pulmonary disease ([place]) 10/09/2015   Chronic pain of left knee 06/16/2017   Constipation, chronic 04/10/2020   Coronary arteriosclerosis in native artery 08/07/2016   Coronary artery disease stent to RCA and circumflex in November 2017, stent to  obtuse marginal branch in 2019 08/07/2016   Diabetes mellitus ([place]) 01/19/2020   Diabetes mellitus type 2 in obese ([place]) 09/13/2019   HTN (hypertension) 01/19/2020   Hyperlipidemia 10/09/2015   Hypertensive heart disease with heart failure ([place]) 10/09/2015   Long-term insulin use ([place]) 08/12/2018   MCI (mild cognitive impairment) 09/13/2019   Medication monitoring encounter 03/01/2020   Mild anxiety 03/14/2016   NSTEMI, initial episode of care [facility]) 12/25/2017   Formatting of this note might be different from the original. 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 C5-6   Primary osteoarthritis of right knee 06/23/2017   Sinus tachycardia by electrocardiogram 11/11/2019   Spondylosis of cervical region without myelopathy or radiculopathy 12/21/2019   Statin intolerance 09/13/2019   Statin myopathy 08/20/2020   Status post coronary artery stent placement 01/19/2018    Past Surgical History:  Procedure Laterality Date   cardiacstent placement     COLONOSCOPY     CORONARY ANGIOGRAPHY N/A 09/03/2021   Procedure: CORONARY ANGIOGRAPHY;  Surgeon: Sherren Mocha, MD;  Location: [facility];  Service: Cardiovascular;  Laterality: N/A;   CORONARY STENT INTERVENTION N/A 09/03/2021   Procedure: CORONARY STENT INTERVENTION;  Surgeon: Sherren Mocha, MD;  Location: [facility];  Service: Cardiovascular;  Laterality: N/A;   hernia repaired  stent replacement      Current Medications: Current Meds  Medication Sig   aspirin 81 MG EC tablet Take 1 tablet (81 mg total) by mouth daily. Swallow whole.   clopidogrel (PLAVIX) 75 MG tablet Take 1 tablet (75 mg total) by mouth daily with breakfast.   Evolocumab (REPATHA SURECLICK) [id number] MG/ML SOAJ Inject 1 pen into the skin every 14 (fourteen)  days.   fluticasone (FLONASE) 50 MCG/ACT nasal spray Place 1 spray into both nostrils daily.   Fluticasone-Salmeterol (ADVAIR) 250-50 MCG/DOSE AEPB Inhale 1 puff into the lungs as needed for shortness of breath.   furosemide (LASIX) 20 MG tablet Take 20 mg by mouth as needed for fluid or edema.   insulin NPH-regular Human (70-30) 100 UNIT/ML injection Inject 100 Units into the skin 2 (two) times daily with a meal.   losartan (COZAAR) 50 MG tablet Take 1 tablet (50 mg total) by mouth daily.   metoprolol tartrate (LOPRESSOR) 50 MG tablet Take 1 tablet (50 mg total) by mouth 2 (two) times daily.   nitroGLYCERIN (NITROSTAT) 0.4 MG SL tablet Place 1 tablet (0.4 mg total) under the tongue every 5 (five) minutes as needed for chest pain.   tamsulosin (FLOMAX) 0.4 MG CAPS capsule Take 0.4 mg by mouth daily.     Allergies:   Ticagrelor, Metformin and related, Pioglitazone, and Statins   Social History   Socioeconomic History   Marital status: Married    Spouse name: Not on file   Number of children: Not on file   Years of education: Not on file   Highest education level: Not on file  Occupational History   Not on file  Tobacco Use   Smoking status: Never   Smokeless tobacco: Never  Substance and Sexual Activity   Alcohol use: Never   Drug use: Never   Sexual activity: Not Currently  Other Topics Concern   Not on file  Social History Narrative   Not on file   Social Determinants of Health   Financial Resource Strain: Not on file  Food Insecurity: Not on file  Transportation Needs: Not on file  Physical Activity: Not on file  Stress: Not on file  Social Connections: Not on file     Family History: The patient's family history includes COPD in his mother; Dementia in his mother; Lung cancer in his father. ROS:   Please see the history of present illness.    All 14 point review of systems negative except as described per history of present illness  EKGs/Labs/Other Studies  Reviewed:      Recent Labs: 09/02/2021: B Natriuretic Peptide 140.0 09/03/2021: ALT 31 09/04/2021: Hemoglobin 13.7; Platelets 169 09/05/2021: BUN 10; Creatinine, Ser 1.16; Potassium 4.7; Sodium 137  Recent Lipid Panel    Component Value Date/Time   CHOL 103 11/21/2021 1400   TRIG 138 11/21/2021 1400   HDL 39 (L) 11/21/2021 1400   CHOLHDL 2.6 11/21/2021 1400   CHOLHDL 5.7 09/05/2021 0145   VLDL 18 09/05/2021 0145   LDLCALC 40 11/21/2021 1400    Physical Exam:    VS:  BP 120/80 (BP Location: Right Arm, Patient Position: Sitting, Cuff Size: Normal)   Pulse (!) 110   Ht 5\' 11"  (1.803 m)   Wt 250 lb (113.4 kg)   SpO2 95%   BMI 34.87 kg/m     Wt Readings from Last 3 Encounters:  02/06/22 250 lb (113.4 kg)  11/21/21 250 lb 12.8 oz (113.8 kg)  09/01/21 250 lb 14.1  oz (113.8 kg)     GEN:  Well nourished, well developed in no acute distress HEENT: Normal NECK: No JVD; No carotid bruits LYMPHATICS: No lymphadenopathy CARDIAC: RRR, no murmurs, no rubs, no gallops RESPIRATORY:  Clear to auscultation without rales, wheezing or rhonchi  ABDOMEN: Soft, non-tender, non-distended MUSCULOSKELETAL:  No edema; No deformity  SKIN: Warm and dry LOWER EXTREMITIES: no swelling NEUROLOGIC:  Alert and oriented x 3 PSYCHIATRIC:  Normal affect   ASSESSMENT:    1. Coronary artery disease involving native heart with unstable angina pectoris, unspecified vessel or lesion type ([place])   2. Primary hypertension   3. Obstructive sleep apnea syndrome   4. Type 2 diabetes mellitus with other specified complication, without long-term current use of insulin ([place])   5. Mixed hyperlipidemia    PLAN:    In order of problems listed above:  Coronary disease stable from that point review.  I will continue dual antiplatelet therapy. Dyslipidemia he is taking Repatha which I advised to continue.  He did review his lab work test done by primary care physician from [facility] LDL of 40 HDL 40 continue present  management Essential hypertension: Blood pressure well controlled continue present management. Swelling of lower extremities I will check his proBNP as well as Chem-7 today.  I also wanted to repeat his echocardiogram at home he does not want to do it he said he had the swelling on and off before and is not he is interested in his left ventricle ejection fraction we will wait for results of proBNP and then decide what to do next. Type 2 diabetes that being followed by primary care physician his hemoglobin A1c however from Jan 30, 2022 was 11.4!  Clearly he does not take care of his diabetes well had a long discussion with him and strongly recommended to consider seeing an endocrinologist.   Medication Adjustments/Labs and Tests Ordered: Current medicines are reviewed at length with the patient today.  Concerns regarding medicines are outlined above.  No orders of the defined types were placed in this encounter.  Medication changes: No orders of the defined types were placed in this encounter.   Signed, Park Liter, MD, [facility] 02/06/2022 1:49 PM    [facility]

## 2022-02-06 NOTE — Addendum Note (Signed)
Addended by: Baldo Ash D on: 02/06/2022 01:57 PM   Modules accepted: Orders

## 2022-02-07 LAB — BASIC METABOLIC PANEL
BUN/Creatinine Ratio: 10 (ref 10–24)
BUN: 11 mg/dL (ref 8–27)
CO2: 25 mmol/L (ref 20–29)
Calcium: 9.8 mg/dL (ref 8.6–10.2)
Chloride: 97 mmol/L (ref 96–106)
Creatinine, Ser: 1.13 mg/dL (ref 0.76–1.27)
Glucose: 275 mg/dL — ABNORMAL HIGH (ref 70–99)
Potassium: 4.8 mmol/L (ref 3.5–5.2)
Sodium: 135 mmol/L (ref 134–144)
eGFR: 67 mL/min/{1.73_m2} (ref >59)

## 2022-02-07 LAB — PRO B NATRIURETIC PEPTIDE: NT-Pro BNP: 193 pg/mL (ref 0–486)

## 2022-02-11 DIAGNOSIS — R5383 Other fatigue: Secondary | ICD-10-CM | POA: Diagnosis not present

## 2022-02-11 DIAGNOSIS — G4733 Obstructive sleep apnea (adult) (pediatric): Secondary | ICD-10-CM | POA: Diagnosis not present

## 2022-02-11 DIAGNOSIS — J4540 Moderate persistent asthma, uncomplicated: Secondary | ICD-10-CM | POA: Diagnosis not present

## 2022-02-11 DIAGNOSIS — Z87891 Personal history of nicotine dependence: Secondary | ICD-10-CM | POA: Diagnosis not present

## 2022-02-18 DIAGNOSIS — E113293 Type 2 diabetes mellitus with mild nonproliferative diabetic retinopathy without macular edema, bilateral: Secondary | ICD-10-CM | POA: Diagnosis not present

## 2022-02-18 DIAGNOSIS — Z7982 Long term (current) use of aspirin: Secondary | ICD-10-CM | POA: Diagnosis not present

## 2022-02-18 DIAGNOSIS — I10 Essential (primary) hypertension: Secondary | ICD-10-CM | POA: Diagnosis not present

## 2022-02-18 DIAGNOSIS — Z794 Long term (current) use of insulin: Secondary | ICD-10-CM | POA: Diagnosis not present

## 2022-02-18 DIAGNOSIS — H25812 Combined forms of age-related cataract, left eye: Secondary | ICD-10-CM | POA: Diagnosis not present

## 2022-02-18 DIAGNOSIS — H40033 Anatomical narrow angle, bilateral: Secondary | ICD-10-CM | POA: Diagnosis not present

## 2022-02-18 DIAGNOSIS — E1136 Type 2 diabetes mellitus with diabetic cataract: Secondary | ICD-10-CM | POA: Diagnosis not present

## 2022-02-18 DIAGNOSIS — H259 Unspecified age-related cataract: Secondary | ICD-10-CM | POA: Diagnosis not present

## 2022-02-22 DIAGNOSIS — G4733 Obstructive sleep apnea (adult) (pediatric): Secondary | ICD-10-CM | POA: Diagnosis not present

## 2022-02-25 ENCOUNTER — Telehealth: Payer: Self-pay

## 2022-02-25 NOTE — Telephone Encounter (Signed)
Patient aware needing a copy of proof of income to assist with his [employer] application

## 2022-03-05 NOTE — Telephone Encounter (Signed)
Proof of income received, [employer] application faxed

## 2022-03-24 DIAGNOSIS — G4733 Obstructive sleep apnea (adult) (pediatric): Secondary | ICD-10-CM | POA: Diagnosis not present

## 2022-04-03 NOTE — Telephone Encounter (Signed)
Request letter of approval through [employer] form date 10/01/21 per [employer] request.

## 2022-04-08 DIAGNOSIS — I499 Cardiac arrhythmia, unspecified: Secondary | ICD-10-CM | POA: Diagnosis not present

## 2022-04-17 DIAGNOSIS — R5383 Other fatigue: Secondary | ICD-10-CM | POA: Diagnosis not present

## 2022-04-17 DIAGNOSIS — J4540 Moderate persistent asthma, uncomplicated: Secondary | ICD-10-CM | POA: Diagnosis not present

## 2022-04-17 DIAGNOSIS — G4733 Obstructive sleep apnea (adult) (pediatric): Secondary | ICD-10-CM | POA: Diagnosis not present

## 2022-04-22 NOTE — Telephone Encounter (Signed)
Approval letter sent to [employer].

## 2022-04-24 DIAGNOSIS — G4733 Obstructive sleep apnea (adult) (pediatric): Secondary | ICD-10-CM | POA: Diagnosis not present

## 2022-04-28 ENCOUNTER — Telehealth: Payer: Self-pay | Admitting: Cardiology

## 2022-04-28 ENCOUNTER — Telehealth: Payer: Self-pay

## 2022-04-28 NOTE — Telephone Encounter (Signed)
Patient is calling stating now that the application has been approved for his Repatha they are needing a prescription due to there being a problem with the one received. He reports a fax has been sent to the office requesting this. Please advise.

## 2022-04-28 NOTE — Telephone Encounter (Signed)
Case IDL [id number] [employer] (Repatha) application approved. Patient notified of status via voice mail (ok per DPR). Letter of approval under media today's date.

## 2022-04-29 NOTE — Telephone Encounter (Signed)
Patient aware Rx called in and he should call after 1 to arrange delivery.

## 2022-05-06 ENCOUNTER — Telehealth: Payer: Self-pay

## 2022-05-06 MED ORDER — REPATHA SURECLICK 140 MG/ML ~~LOC~~ SOAJ: 1.0000 "pen " | SUBCUTANEOUS | 11 refills | Status: DC

## 2022-05-06 NOTE — Telephone Encounter (Signed)
Prescription for Repatha printed, awaiting signature from Dr. Kirtland Bouchard

## 2022-05-08 DIAGNOSIS — E785 Hyperlipidemia, unspecified: Secondary | ICD-10-CM | POA: Diagnosis not present

## 2022-05-08 DIAGNOSIS — I10 Essential (primary) hypertension: Secondary | ICD-10-CM | POA: Diagnosis not present

## 2022-05-25 DIAGNOSIS — G4733 Obstructive sleep apnea (adult) (pediatric): Secondary | ICD-10-CM | POA: Diagnosis not present

## 2022-06-07 DIAGNOSIS — I10 Essential (primary) hypertension: Secondary | ICD-10-CM | POA: Diagnosis not present

## 2022-06-07 DIAGNOSIS — E785 Hyperlipidemia, unspecified: Secondary | ICD-10-CM | POA: Diagnosis not present

## 2022-06-24 DIAGNOSIS — G4733 Obstructive sleep apnea (adult) (pediatric): Secondary | ICD-10-CM | POA: Diagnosis not present

## 2022-06-27 DIAGNOSIS — G4733 Obstructive sleep apnea (adult) (pediatric): Secondary | ICD-10-CM | POA: Diagnosis not present

## 2022-07-03 DIAGNOSIS — R5383 Other fatigue: Secondary | ICD-10-CM | POA: Diagnosis not present

## 2022-07-03 DIAGNOSIS — G4733 Obstructive sleep apnea (adult) (pediatric): Secondary | ICD-10-CM | POA: Diagnosis not present

## 2022-07-03 DIAGNOSIS — I4891 Unspecified atrial fibrillation: Secondary | ICD-10-CM | POA: Diagnosis not present

## 2022-07-03 DIAGNOSIS — Z87891 Personal history of nicotine dependence: Secondary | ICD-10-CM | POA: Diagnosis not present

## 2022-07-03 DIAGNOSIS — J4540 Moderate persistent asthma, uncomplicated: Secondary | ICD-10-CM | POA: Diagnosis not present

## 2022-07-23 DIAGNOSIS — G4733 Obstructive sleep apnea (adult) (pediatric): Secondary | ICD-10-CM | POA: Diagnosis not present

## 2022-07-25 DIAGNOSIS — G4733 Obstructive sleep apnea (adult) (pediatric): Secondary | ICD-10-CM | POA: Diagnosis not present

## 2022-08-14 DIAGNOSIS — J4540 Moderate persistent asthma, uncomplicated: Secondary | ICD-10-CM | POA: Diagnosis not present

## 2022-08-14 DIAGNOSIS — Z87891 Personal history of nicotine dependence: Secondary | ICD-10-CM | POA: Diagnosis not present

## 2022-08-14 DIAGNOSIS — I4891 Unspecified atrial fibrillation: Secondary | ICD-10-CM | POA: Diagnosis not present

## 2022-08-14 DIAGNOSIS — G4733 Obstructive sleep apnea (adult) (pediatric): Secondary | ICD-10-CM | POA: Diagnosis not present

## 2022-08-14 DIAGNOSIS — R5383 Other fatigue: Secondary | ICD-10-CM | POA: Diagnosis not present

## 2022-08-24 DIAGNOSIS — G4733 Obstructive sleep apnea (adult) (pediatric): Secondary | ICD-10-CM | POA: Diagnosis not present

## 2022-09-18 ENCOUNTER — Other Ambulatory Visit (HOSPITAL_COMMUNITY): Payer: Self-pay

## 2022-09-24 DIAGNOSIS — G4733 Obstructive sleep apnea (adult) (pediatric): Secondary | ICD-10-CM | POA: Diagnosis not present

## 2022-09-25 DIAGNOSIS — I4891 Unspecified atrial fibrillation: Secondary | ICD-10-CM | POA: Diagnosis not present

## 2022-09-25 DIAGNOSIS — R5383 Other fatigue: Secondary | ICD-10-CM | POA: Diagnosis not present

## 2022-09-25 DIAGNOSIS — Z87891 Personal history of nicotine dependence: Secondary | ICD-10-CM | POA: Diagnosis not present

## 2022-09-25 DIAGNOSIS — J4540 Moderate persistent asthma, uncomplicated: Secondary | ICD-10-CM | POA: Diagnosis not present

## 2022-09-25 DIAGNOSIS — G4733 Obstructive sleep apnea (adult) (pediatric): Secondary | ICD-10-CM | POA: Diagnosis not present

## 2022-10-01 ENCOUNTER — Ambulatory Visit: Payer: Medicare Other | Attending: Cardiology

## 2022-10-01 ENCOUNTER — Ambulatory Visit: Payer: Medicare Other | Attending: Cardiology | Admitting: Cardiology

## 2022-10-01 ENCOUNTER — Encounter: Payer: Self-pay | Admitting: Cardiology

## 2022-10-01 VITALS — BP 136/74 | HR 110 | Ht 71.0 in | Wt 239.8 lb

## 2022-10-01 DIAGNOSIS — I4891 Unspecified atrial fibrillation: Secondary | ICD-10-CM

## 2022-10-01 DIAGNOSIS — E1169 Type 2 diabetes mellitus with other specified complication: Secondary | ICD-10-CM | POA: Diagnosis not present

## 2022-10-01 DIAGNOSIS — I10 Essential (primary) hypertension: Secondary | ICD-10-CM

## 2022-10-01 DIAGNOSIS — I25110 Atherosclerotic heart disease of native coronary artery with unstable angina pectoris: Secondary | ICD-10-CM

## 2022-10-01 NOTE — Progress Notes (Addendum)
Cardiology Office Note:    Date:  10/01/2022   ID:  Lance Shepherd, DOB 1945-12-18, MRN [id number]  PCP:  Ronita Hipps, MD  Cardiologist:  Jenne Campus, MD    Referring MD: Ronita Hipps, MD   Chief Complaint  Patient presents with   Abnormal ECG    History of Present Illness:    Lance Shepherd is a 77 y.o. male  with past medical history significant for coronary artery disease, status post PTCA and stenting of the right coronary artery and circumflex done in November 2016, then later stenting of obtuse marginal branch done in April 2019, diabetes, hyperlipidemia, hypertension, COPD.  Most recently in August 29, 2021 he presented to the hospital with COVID after being evaluated in the emergency started complaining of having chest pain.  He was find to have non-STEMI cardiac catheterization performed on 27 December showed 99% stenosis of large obtuse marginal branch treated with PTCA and stenting with drug-eluting stent.  Also he was noted to have moderate RCA stenosis as well as moderate diffuse proximal LAD stenosis.  He was referred back to [place] because he apparently was found to have atrial fibrillation.  I do not have EKG confirming that.  He was also put on some new medication he does not have any idea what medications that.  EKG today shows sinus tachycardia with right bundle branch block.  Overall he said he is doing fine.  He denies have any chest pain tightness squeezing pressure burning chest no palpitations  Past Medical History:  Diagnosis Date   Acute neck pain 12/21/2019   Allergic rhinitis 11/16/2018   Behavior disturbance 04/10/2020   Benign prostatic hyperplasia without lower urinary tract symptoms 10/09/2015   Cervical discitis 01/01/2020   Chronic obstructive pulmonary disease ([place]) 10/09/2015   Chronic pain of left knee 06/16/2017   Constipation, chronic 04/10/2020   Coronary arteriosclerosis in native artery 08/07/2016   Coronary artery disease stent to RCA and circumflex  in November 2017, stent to obtuse marginal branch in 2019 08/07/2016   Diabetes mellitus ([place]) 01/19/2020   Diabetes mellitus type 2 in obese ([place]) 09/13/2019   HTN (hypertension) 01/19/2020   Hyperlipidemia 10/09/2015   Hypertensive heart disease with heart failure ([place]) 10/09/2015   Long-term insulin use ([place]) 08/12/2018   MCI (mild cognitive impairment) 09/13/2019   Medication monitoring encounter 03/01/2020   Mild anxiety 03/14/2016   NSTEMI, initial episode of care [facility]) 12/25/2017   Formatting of this note might be different from the original. 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 C5-6   Primary osteoarthritis of right knee 06/23/2017   Sinus tachycardia by electrocardiogram 11/11/2019   Spondylosis of cervical region without myelopathy or radiculopathy 12/21/2019   Statin intolerance 09/13/2019   Statin myopathy 08/20/2020   Status post coronary artery stent placement 01/19/2018    Past Surgical History:  Procedure Laterality Date   cardiacstent placement     COLONOSCOPY     CORONARY ANGIOGRAPHY N/A 09/03/2021   Procedure: CORONARY ANGIOGRAPHY;  Surgeon: Sherren Mocha, MD;  Location: [facility];  Service: Cardiovascular;  Laterality: N/A;   CORONARY STENT INTERVENTION N/A 09/03/2021   Procedure: CORONARY STENT INTERVENTION;  Surgeon: Sherren Mocha, MD;  Location: [facility];  Service: Cardiovascular;  Laterality: N/A;  hernia repaired     stent replacement      Current Medications: Current Meds  Medication Sig   aspirin 81 MG EC tablet Take 1 tablet (81 mg total) by mouth daily. Swallow whole.   clopidogrel (PLAVIX) 75 MG tablet Take 1 tablet (75 mg total) by mouth daily with breakfast.   Evolocumab (REPATHA SURECLICK) 140 MG/ML SOAJ Inject 1 pen  into  the skin every 14 (fourteen) days.   fluticasone (FLONASE) 50 MCG/ACT nasal spray Place 1 spray into both nostrils daily.   Fluticasone-Salmeterol (ADVAIR) 250-50 MCG/DOSE AEPB Inhale 1 puff into the lungs as needed for shortness of breath.   furosemide (LASIX) 20 MG tablet Take 20 mg by mouth as needed for fluid or edema.   insulin NPH-regular Human (70-30) 100 UNIT/ML injection Inject 100 Units into the skin 2 (two) times daily with a meal.   losartan (COZAAR) 50 MG tablet Take 1 tablet (50 mg total) by mouth daily.   metoprolol tartrate (LOPRESSOR) 50 MG tablet Take 1 tablet (50 mg total) by mouth 2 (two) times daily.   nitroGLYCERIN (NITROSTAT) 0.4 MG SL tablet Place 1 tablet (0.4 mg total) under the tongue every 5 (five) minutes as needed for chest pain.   tamsulosin (FLOMAX) 0.4 MG CAPS capsule Take 0.4 mg by mouth daily.     Allergies:   Ticagrelor, Metformin and related, Pioglitazone, and Statins   Social History   Socioeconomic History   Marital status: Married    Spouse name: Not on file   Number of children: Not on file   Years of education: Not on file   Highest education level: Not on file  Occupational History   Not on file  Tobacco Use   Smoking status: Never   Smokeless tobacco: Never  Substance and Sexual Activity   Alcohol use: Never   Drug use: Never   Sexual activity: Not Currently  Other Topics Concern   Not on file  Social History Narrative   Not on file   Social Determinants of Health   Financial Resource Strain: Not on file  Food Insecurity: Not on file  Transportation Needs: Not on file  Physical Activity: Not on file  Stress: Not on file  Social Connections: Not on file     Family History: The patient's family history includes COPD in his mother; Dementia in his mother; Lung cancer in his father. ROS:   Please see the history of present illness.    All 14 point review of systems negative except as described per history of present  illness  EKGs/Labs/Other Studies Reviewed:      Recent Labs: 02/06/2022: BUN 11; Creatinine, Ser 1.13; NT-Pro BNP 193; Potassium 4.8; Sodium 135  Recent Lipid Panel    Component Value Date/Time   CHOL 103 11/21/2021 1400   TRIG 138 11/21/2021 1400   HDL 39 (L) 11/21/2021 1400   CHOLHDL 2.6 11/21/2021 1400   CHOLHDL 5.7 09/05/2021 0145   VLDL 18 09/05/2021 0145   LDLCALC 40 11/21/2021 1400    Physical Exam:    VS:  BP 136/74 (BP Location: Left Arm, Patient Position: Sitting)   Pulse (!) 110   Ht 5\' 11"  (1.803 m)   Wt 239 lb 12.8 oz (108.8 kg)   SpO2 96%   BMI 33.45 kg/m     Wt Readings from Last 3 Encounters:  10/01/22 239 lb 12.8 oz (108.8 kg)  02/06/22 250 lb (113.4 kg)  11/21/21 250 lb 12.8 oz (113.8 kg)  GEN:  Well nourished, well developed in no acute distress HEENT: Normal NECK: No JVD; No carotid bruits LYMPHATICS: No lymphadenopathy CARDIAC: RRR, no murmurs, no rubs, no gallops RESPIRATORY:  Clear to auscultation without rales, wheezing or rhonchi  ABDOMEN: Soft, non-tender, non-distended MUSCULOSKELETAL:  No edema; No deformity  SKIN: Warm and dry LOWER EXTREMITIES: no swelling NEUROLOGIC:  Alert and oriented x 3 PSYCHIATRIC:  Normal affect   ASSESSMENT:    1. Coronary artery disease involving native heart with unstable angina pectoris, unspecified vessel or lesion type ([place])   2. Primary hypertension   3. Type 2 diabetes mellitus with other specified complication, without long-term current use of insulin ([facility])    PLAN:    In order of problems listed above:  Coronary artery disease stable from that point review on appropriate medication which I will continue. Paroxysmal atrial fibrillation this is a new diagnosis however I do not see documentation of it, will call [facility] to send [place] records to see if it is truly atrial fibrillation.  If that is the case he need to be anticoagulated.  He is taking already beta-blocker which I will  continue.  I will ask him to wear Zio patch for at least 1 week to see if he got any recurrences of this arrhythmia. Essential hypertension decently controlled. Type 2 diabetes stable  I received records from his primary care physician, all EKG has been carefully reviewed, I do not see any evidence of atrial fibrillation.  Again patient will monitor to see if he does have some atrial fibrillation.  Medication Adjustments/Labs and Tests Ordered: Current medicines are reviewed at length with the patient today.  Concerns regarding medicines are outlined above.  No orders of the defined types were placed in this encounter.  Medication changes: No orders of the defined types were placed in this encounter.   Signed, Park Liter, MD, [facility] 10/01/2022 8:31 AM    [place]

## 2022-10-01 NOTE — Addendum Note (Signed)
Addended by: Jacobo Forest D on: 10/01/2022 08:57 AM   Modules accepted: Orders

## 2022-10-01 NOTE — Patient Instructions (Signed)
Medication Instructions:  Your physician recommends that you continue on your current medications as directed. Please refer to the Current Medication list given to you today.  *If you need a refill on your cardiac medications before your next appointment, please call your pharmacy*   Lab Work: None Ordered If you have labs (blood work) drawn today and your tests are completely normal, you will receive your results only by: [place] (if you have MyChart) OR A paper copy in the mail If you have any lab test that is abnormal or we need to change your treatment, we will call you to review the results.   Testing/Procedures:  WHY IS MY DOCTOR PRESCRIBING ZIO? The Zio system is proven and trusted by physicians to detect and diagnose irregular heart rhythms -- and has been prescribed to hundreds of thousands of patients.  The FDA has cleared the Zio system to monitor for many different kinds of irregular heart rhythms. In a study, physicians were able to reach a diagnosis 90% of the time with the Zio system1.  You can wear the Zio monitor -- a small, discreet, comfortable patch -- during your normal day-to-day activity, including while you sleep, shower, and exercise, while it records every single heartbeat for analysis.  1Barrett, P., et al. Comparison of 24 Hour Holter Monitoring Versus 14 Day Novel Adhesive Patch Electrocardiographic Monitoring. [place], 2014.  ZIO VS. HOLTER MONITORING The Zio monitor can be comfortably worn for up to 14 days. Holter monitors can be worn for 24 to 48 hours, limiting the time to record any irregular heart rhythms you may have. Zio is able to capture data for the 51% of patients who have their first symptom-triggered arrhythmia after 48 hours.1  LIVE WITHOUT RESTRICTIONS The Zio ambulatory cardiac monitor is a small, unobtrusive, and water-resistant patch--you might even forget you're wearing it. The Zio monitor records and stores  every beat of your heart, whether you're sleeping, working out, or showering.     Follow-Up: At [facility], you and your health needs are our priority.  As part of our continuing mission to provide you with exceptional heart care, we have created designated Provider Care Teams.  These Care Teams include your primary Cardiologist (physician) and Advanced Practice Providers (APPs -  Physician Assistants and Nurse Practitioners) who all work together to provide you with the care you need, when you need it.  We recommend signing up for the patient portal called "MyChart".  Sign up information is provided on this After Visit Summary.  MyChart is used to connect with patients for Virtual Visits (Telemedicine).  Patients are able to view lab/test results, encounter notes, upcoming appointments, etc.  Non-urgent messages can be sent to your provider as well.   To learn more about what you can do with MyChart, go to [email or web address].    Your next appointment:   3 month(s)  The format for your next appointment:   In Person  Provider:   Jenne Campus, MD    Other Instructions NA

## 2022-10-08 DIAGNOSIS — E785 Hyperlipidemia, unspecified: Secondary | ICD-10-CM | POA: Diagnosis not present

## 2022-10-08 DIAGNOSIS — I10 Essential (primary) hypertension: Secondary | ICD-10-CM | POA: Diagnosis not present

## 2022-10-08 DIAGNOSIS — E1165 Type 2 diabetes mellitus with hyperglycemia: Secondary | ICD-10-CM | POA: Diagnosis not present

## 2022-10-16 DIAGNOSIS — I4891 Unspecified atrial fibrillation: Secondary | ICD-10-CM | POA: Diagnosis not present

## 2022-10-22 DIAGNOSIS — E1165 Type 2 diabetes mellitus with hyperglycemia: Secondary | ICD-10-CM | POA: Diagnosis not present

## 2022-10-22 DIAGNOSIS — Z0000 Encounter for general adult medical examination without abnormal findings: Secondary | ICD-10-CM | POA: Diagnosis not present

## 2022-10-22 DIAGNOSIS — K5909 Other constipation: Secondary | ICD-10-CM | POA: Diagnosis not present

## 2022-10-22 DIAGNOSIS — Z23 Encounter for immunization: Secondary | ICD-10-CM | POA: Diagnosis not present

## 2022-10-23 DIAGNOSIS — M170 Bilateral primary osteoarthritis of knee: Secondary | ICD-10-CM | POA: Diagnosis not present

## 2022-10-25 DIAGNOSIS — G4733 Obstructive sleep apnea (adult) (pediatric): Secondary | ICD-10-CM | POA: Diagnosis not present

## 2022-11-06 ENCOUNTER — Telehealth: Payer: Self-pay

## 2022-11-06 NOTE — Telephone Encounter (Signed)
LVM to call regarding monitor results

## 2022-11-23 DIAGNOSIS — G4733 Obstructive sleep apnea (adult) (pediatric): Secondary | ICD-10-CM | POA: Diagnosis not present

## 2022-12-31 ENCOUNTER — Encounter: Payer: Self-pay | Admitting: Cardiology

## 2022-12-31 ENCOUNTER — Ambulatory Visit: Payer: Medicare Other | Attending: Cardiology | Admitting: Cardiology

## 2022-12-31 VITALS — BP 132/90 | HR 95 | Ht 71.0 in | Wt 241.2 lb

## 2022-12-31 DIAGNOSIS — E782 Mixed hyperlipidemia: Secondary | ICD-10-CM

## 2022-12-31 DIAGNOSIS — I10 Essential (primary) hypertension: Secondary | ICD-10-CM | POA: Diagnosis not present

## 2022-12-31 DIAGNOSIS — I25110 Atherosclerotic heart disease of native coronary artery with unstable angina pectoris: Secondary | ICD-10-CM

## 2022-12-31 DIAGNOSIS — E1169 Type 2 diabetes mellitus with other specified complication: Secondary | ICD-10-CM | POA: Diagnosis not present

## 2022-12-31 DIAGNOSIS — R0609 Other forms of dyspnea: Secondary | ICD-10-CM

## 2022-12-31 NOTE — Progress Notes (Signed)
Cardiology Office Note:    Date:  12/31/2022   ID:  Lance Shepherd, DOB August 20, 1946, MRN [id number]  PCP:  Lance Ponto, MD  Cardiologist:  Lance Balsam, MD    Referring MD: Lance Ponto, MD   Chief Complaint  Patient presents with   Foot Swelling    History of Present Illness:    Lance Shepherd is a 77 y.o. male   with past medical history significant for coronary artery disease, status post PTCA and stenting of the right coronary artery and circumflex done in November 2016, then later stenting of obtuse marginal branch done in April 2019, diabetes, hyperlipidemia, hypertension, COPD.  Most recently in August 29, 2021 he presented to the hospital with COVID after being evaluated in the emergency started complaining of having chest pain.  He was find to have non-STEMI cardiac catheterization performed on 27 December showed 99% stenosis of large obtuse marginal branch treated with PTCA and stenting with drug-eluting stent.  Also he was noted to have moderate RCA stenosis as well as moderate diffuse proximal LAD stenosis.  He comes today 2 months of follow-up.  He brought up issue of swelling of lower extremities.  He is concerned about it.  Man described to have some fatigue tiredness shortness of breath as usual he always sleeps with his head but he has been doing this for many years.  Denies have any chest pain tightness squeezing pressure burning chest  Past Medical History:  Diagnosis Date   Acute neck pain 12/21/2019   Allergic rhinitis 11/16/2018   Behavior disturbance 04/10/2020   Benign prostatic hyperplasia without lower urinary tract symptoms 10/09/2015   Cervical discitis 01/01/2020   Chronic obstructive pulmonary disease 10/09/2015   Chronic pain of left knee 06/16/2017   Constipation, chronic 04/10/2020   Coronary arteriosclerosis in native artery 08/07/2016   Coronary artery disease stent to RCA and circumflex in November 2017, stent to obtuse marginal branch in 2019 08/07/2016    Diabetes mellitus 01/19/2020   Diabetes mellitus type 2 in obese 09/13/2019   HTN (hypertension) 01/19/2020   Hyperlipidemia 10/09/2015   Hypertensive heart disease with heart failure 10/09/2015   Long-term insulin use 08/12/2018   MCI (mild cognitive impairment) 09/13/2019   Medication monitoring encounter 03/01/2020   Mild anxiety 03/14/2016   NSTEMI, initial episode of care 12/25/2017   Formatting of this note might be different from the original. 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 C5-6   Primary osteoarthritis of right knee 06/23/2017   Sinus tachycardia by electrocardiogram 11/11/2019   Spondylosis of cervical region without myelopathy or radiculopathy 12/21/2019   Statin intolerance 09/13/2019   Statin myopathy 08/20/2020   Status post coronary artery stent placement 01/19/2018    Past Surgical History:  Procedure Laterality Date   cardiacstent placement     COLONOSCOPY     CORONARY ANGIOGRAPHY N/A 09/03/2021   Procedure: CORONARY ANGIOGRAPHY;  Surgeon: Tonny Bollman, MD;  Location: [facility] INVASIVE CV LAB;  Service: Cardiovascular;  Laterality: N/A;   CORONARY STENT INTERVENTION N/A 09/03/2021   Procedure: CORONARY STENT INTERVENTION;  Surgeon: Tonny Bollman, MD;  Location: [facility] INVASIVE CV LAB;  Service: Cardiovascular;  Laterality: N/A;   hernia repaired     stent replacement      Current Medications: Current Meds  Medication Sig   aspirin 81 MG EC tablet Take 1 tablet (81 mg total) by mouth daily. Swallow whole.   clopidogrel (PLAVIX) 75 MG tablet Take 1 tablet (75 mg total) by mouth daily with breakfast.   Evolocumab (REPATHA SURECLICK) 140 MG/ML SOAJ Inject 1 pen  into the skin every 14 (fourteen) days.   fluticasone (FLONASE) 50 MCG/ACT nasal spray Place 1 spray into both  nostrils daily.   Fluticasone-Salmeterol (ADVAIR) 250-50 MCG/DOSE AEPB Inhale 1 puff into the lungs as needed for shortness of breath.   furosemide (LASIX) 20 MG tablet Take 20 mg by mouth as needed for fluid or edema.   insulin NPH-regular Human (70-30) 100 UNIT/ML injection Inject 100 Units into the skin 2 (two) times daily with a meal.   losartan (COZAAR) 50 MG tablet Take 1 tablet (50 mg total) by mouth daily.   metoprolol tartrate (LOPRESSOR) 50 MG tablet Take 1 tablet (50 mg total) by mouth 2 (two) times daily.   nitroGLYCERIN (NITROSTAT) 0.4 MG SL tablet Place 1 tablet (0.4 mg total) under the tongue every 5 (five) minutes as needed for chest pain.   tamsulosin (FLOMAX) 0.4 MG CAPS capsule Take 0.4 mg by mouth daily.     Allergies:   Ticagrelor, Metformin and related, Pioglitazone, and Statins   Social History   Socioeconomic History   Marital status: Married    Spouse name: Not on file   Number of children: Not on file   Years of education: Not on file   Highest education level: Not on file  Occupational History   Not on file  Tobacco Use   Smoking status: Never   Smokeless tobacco: Never  Substance and Sexual Activity   Alcohol use: Never   Drug use: Never   Sexual activity: Not Currently  Other Topics Concern   Not on file  Social History Narrative   Not on file   Social Determinants of Health   Financial Resource Strain: Not on file  Food Insecurity: Not on file  Transportation Needs: Not on file  Physical Activity: Not on file  Stress: Not on file  Social Connections: Not on file     Family History: The patient's family history includes COPD in his mother; Dementia in his mother; Lung cancer in his father. ROS:   Please see the history of present illness.    All 14 point review of systems negative except as described per history of present illness  EKGs/Labs/Other Studies Reviewed:      Recent Labs: 02/06/2022: BUN 11; Creatinine, Ser 1.13; NT-Pro BNP  193; Potassium 4.8; Sodium 135  Recent Lipid Panel    Component Value Date/Time   CHOL 103 11/21/2021 1400   TRIG 138 11/21/2021 1400   HDL 39 (L) 11/21/2021 1400   CHOLHDL 2.6 11/21/2021 1400   CHOLHDL 5.7 09/05/2021 0145   VLDL 18 09/05/2021 0145   LDLCALC 40 11/21/2021 1400    Physical Exam:    VS:  BP (!) 132/90 (BP Location: Left Arm, Patient Position: Sitting)   Pulse 95   Ht  (1.803 m)   Wt 241 lb 3.2 oz (109.4 kg)   SpO2 94%   BMI 33.64 kg/m     Wt Readings from Last 3 Encounters:  12/31/22 241 lb 3.2 oz (109.4 kg)  10/01/22 239 lb 12.8 oz (108.8 kg)  02/06/22 250 lb (113.4 kg)     GEN:  Well nourished, well developed in no acute distress HEENT: Normal NECK: No JVD;  No carotid bruits LYMPHATICS: No lymphadenopathy CARDIAC: RRR, no murmurs, no rubs, no gallops RESPIRATORY:  Clear to auscultation without rales, wheezing or rhonchi  ABDOMEN: Soft, non-tender, non-distended MUSCULOSKELETAL:  No edema; No deformity  SKIN: Warm and dry LOWER EXTREMITIES: no swelling NEUROLOGIC:  Alert and oriented x 3 PSYCHIATRIC:  Normal affect   ASSESSMENT:    1. Coronary artery disease involving native heart with unstable angina pectoris, unspecified vessel or lesion type   2. Primary hypertension   3. Type 2 diabetes mellitus with other specified complication, without long-term current use of insulin   4. Mixed hyperlipidemia    PLAN:    In order of problems listed above:  Coronary disease seems to be stable from that point review an appropriate guideline directed medical therapy which includes dual antiplatelet therapy. Swelling of lower extremities reports concerns about congestive heart failure, proBNP Chem-7 will be done today can be scheduled to have another echocardiogram.  In the meantime continue her beta-blocker and Cozaar. Question about atrial fibrillation I never seen an documentation of it.  He wore a monitor which showed some short episode of  supraventricular tachycardia otherwise there is no worrisome arrhythmia we will continue monitoring. Type 2 diabetes did be followed by internal medicine team.  His hemoglobin A1c is 11.4 in May of last year.  Obviously to be better controlled. Dyslipidemia did review K PN which show me LDL 40 HDL 40 we will continue present management and however he is allergic to statin and he does not want to take any more medication for his cholesterol.  Will continue talk to him about it when   Medication Adjustments/Labs and Tests Ordered: Current medicines are reviewed at length with the patient today.  Concerns regarding medicines are outlined above.  No orders of the defined types were placed in this encounter.  Medication changes: No orders of the defined types were placed in this encounter.   Signed, Georgeanna Lea, MD, [facility] 12/31/2022 12:57 PM    [facility]

## 2022-12-31 NOTE — Patient Instructions (Signed)
Medication Instructions:  Your physician recommends that you continue on your current medications as directed. Please refer to the Current Medication list given to you today.  *If you need a refill on your cardiac medications before your next appointment, please call your pharmacy*   Lab Work: BMP, ProBNP- Today If you have labs (blood work) drawn today and your tests are completely normal, you will receive your results only by: MyChart Message (if you have MyChart) OR A paper copy in the mail If you have any lab test that is abnormal or we need to change your treatment, we will call you to review the results.   Testing/Procedures: Your physician has requested that you have an echocardiogram. Echocardiography is a painless test that uses sound waves to create images of your heart. It provides your doctor with information about the size and shape of your heart and how well your heart's chambers and valves are working. This procedure takes approximately one hour. There are no restrictions for this procedure. Please do NOT wear cologne, perfume, aftershave, or lotions (deodorant is allowed). Please arrive 15 minutes prior to your appointment time.    Follow-Up: At [facility], you and your health needs are our priority.  As part of our continuing mission to provide you with exceptional heart care, we have created designated Provider Care Teams.  These Care Teams include your primary Cardiologist (physician) and Advanced Practice Providers (APPs -  Physician Assistants and Nurse Practitioners) who all work together to provide you with the care you need, when you need it.  We recommend signing up for the patient portal called "MyChart".  Sign up information is provided on this After Visit Summary.  MyChart is used to connect with patients for Virtual Visits (Telemedicine).  Patients are able to view lab/test results, encounter notes, upcoming appointments, etc.  Non-urgent messages can be sent to  your provider as well.   To learn more about what you can do with MyChart, go to [email or web address].com.    Your next appointment:   6 month(s)  The format for your next appointment:   In Person  Provider:   Robert Krasowski, MD    Other Instructions NA  

## 2022-12-31 NOTE — Addendum Note (Signed)
Addended by: Baldo Ash D on: 12/31/2022 01:02 PM   Modules accepted: Orders

## 2023-01-01 LAB — BASIC METABOLIC PANEL
BUN/Creatinine Ratio: 15 (ref 10–24)
BUN: 18 mg/dL (ref 8–27)
CO2: 25 mmol/L (ref 20–29)
Calcium: 10 mg/dL (ref 8.6–10.2)
Chloride: 97 mmol/L (ref 96–106)
Creatinine, Ser: 1.2 mg/dL (ref 0.76–1.27)
Glucose: 294 mg/dL — ABNORMAL HIGH (ref 70–99)
Potassium: 5.5 mmol/L — ABNORMAL HIGH (ref 3.5–5.2)
Sodium: 137 mmol/L (ref 134–144)
eGFR: 62 mL/min/{1.73_m2} (ref >59)

## 2023-01-01 LAB — PRO B NATRIURETIC PEPTIDE: NT-Pro BNP: 192 pg/mL (ref 0–486)

## 2023-01-12 ENCOUNTER — Telehealth: Payer: Self-pay

## 2023-01-12 DIAGNOSIS — I25110 Atherosclerotic heart disease of native coronary artery with unstable angina pectoris: Secondary | ICD-10-CM

## 2023-01-12 DIAGNOSIS — I10 Essential (primary) hypertension: Secondary | ICD-10-CM

## 2023-01-12 NOTE — Telephone Encounter (Signed)
Patient notified of results and recommendations and agreed with plan, lab order on file.   

## 2023-01-12 NOTE — Telephone Encounter (Signed)
-----   Message from Georgeanna Lea, MD sent at 01/07/2023  1:55 PM EDT ----- Potassium evaded, lets repeat Chem-7 again

## 2023-01-14 ENCOUNTER — Ambulatory Visit: Payer: Medicare Other | Attending: Cardiology

## 2023-01-14 DIAGNOSIS — I25110 Atherosclerotic heart disease of native coronary artery with unstable angina pectoris: Secondary | ICD-10-CM | POA: Diagnosis not present

## 2023-01-14 DIAGNOSIS — R0609 Other forms of dyspnea: Secondary | ICD-10-CM

## 2023-01-14 DIAGNOSIS — I10 Essential (primary) hypertension: Secondary | ICD-10-CM | POA: Diagnosis not present

## 2023-01-14 LAB — ECHOCARDIOGRAM COMPLETE: S' Lateral: 3.2 cm

## 2023-01-14 MED ORDER — PERFLUTREN LIPID MICROSPHERE
1.0000 mL | INTRAVENOUS | Status: AC | PRN
  Administered 2023-01-14: 3 mL via INTRAVENOUS

## 2023-01-15 LAB — BASIC METABOLIC PANEL
BUN/Creatinine Ratio: 13 (ref 10–24)
BUN: 14 mg/dL (ref 8–27)
CO2: 25 mmol/L (ref 20–29)
Calcium: 9.9 mg/dL (ref 8.6–10.2)
Chloride: 102 mmol/L (ref 96–106)
Creatinine, Ser: 1.05 mg/dL (ref 0.76–1.27)
Glucose: 139 mg/dL — ABNORMAL HIGH (ref 70–99)
Potassium: 4.5 mmol/L (ref 3.5–5.2)
Sodium: 144 mmol/L (ref 134–144)
eGFR: 73 mL/min/{1.73_m2} (ref >59)

## 2023-01-22 ENCOUNTER — Telehealth: Payer: Self-pay

## 2023-01-22 DIAGNOSIS — I25110 Atherosclerotic heart disease of native coronary artery with unstable angina pectoris: Secondary | ICD-10-CM

## 2023-01-22 DIAGNOSIS — I10 Essential (primary) hypertension: Secondary | ICD-10-CM

## 2023-01-22 DIAGNOSIS — Z79899 Other long term (current) drug therapy: Secondary | ICD-10-CM

## 2023-01-22 MED ORDER — ENTRESTO 24-26 MG PO TABS: 1.0000 | ORAL_TABLET | Freq: Two times a day (BID) | ORAL | 2 refills | Status: DC

## 2023-01-22 MED ORDER — ENTRESTO 24-26 MG PO TABS: 1.0000 | ORAL_TABLET | Freq: Two times a day (BID) | ORAL | 0 refills | Status: DC

## 2023-01-22 NOTE — Telephone Encounter (Signed)
-----   Message from Georgeanna Lea, MD sent at 01/15/2023  4:58 PM EDT ----- Echocardiogram showed mildly reduced left ventricle ejection fraction.  Please stop losartan and give Entresto 24-26 twice daily, Chem-7 need to be done next week

## 2023-01-22 NOTE — Telephone Encounter (Signed)
Patient notified of results and recommendations and agreed with plan, medication sent, lab order on file. Aware to stop losartan before he starts Entresto.

## 2023-01-22 NOTE — Telephone Encounter (Signed)
-----   Message from Georgeanna Lea, MD sent at 01/16/2023  9:54 AM EDT ----- Chem-7 is good continue present management

## 2023-01-30 DIAGNOSIS — Z79899 Other long term (current) drug therapy: Secondary | ICD-10-CM | POA: Diagnosis not present

## 2023-01-30 DIAGNOSIS — I10 Essential (primary) hypertension: Secondary | ICD-10-CM | POA: Diagnosis not present

## 2023-01-30 DIAGNOSIS — I25110 Atherosclerotic heart disease of native coronary artery with unstable angina pectoris: Secondary | ICD-10-CM | POA: Diagnosis not present

## 2023-01-31 LAB — BASIC METABOLIC PANEL WITH GFR
BUN/Creatinine Ratio: 11 (ref 10–24)
BUN: 12 mg/dL (ref 8–27)
CO2: 24 mmol/L (ref 20–29)
Calcium: 9.5 mg/dL (ref 8.6–10.2)
Chloride: 97 mmol/L (ref 96–106)
Creatinine, Ser: 1.12 mg/dL (ref 0.76–1.27)
Glucose: 281 mg/dL — ABNORMAL HIGH (ref 70–99)
Potassium: 4.5 mmol/L (ref 3.5–5.2)
Sodium: 137 mmol/L (ref 134–144)
eGFR: 68 mL/min/1.73

## 2023-02-05 ENCOUNTER — Telehealth: Payer: Self-pay

## 2023-02-05 NOTE — Telephone Encounter (Signed)
Patient notified of results through VM

## 2023-02-05 NOTE — Telephone Encounter (Signed)
-----   Message from Georgeanna Lea, MD sent at 02/05/2023  9:51 AM EDT ----- Chem-7 looks good, continue present management

## 2023-04-14 ENCOUNTER — Other Ambulatory Visit: Payer: Self-pay

## 2023-04-14 ENCOUNTER — Telehealth: Payer: Self-pay | Admitting: Cardiology

## 2023-04-14 MED ORDER — ENTRESTO 24-26 MG PO TABS: 1.0000 | ORAL_TABLET | Freq: Two times a day (BID) | ORAL | 3 refills | Status: DC

## 2023-04-14 NOTE — Telephone Encounter (Signed)
Entresto 24-26 1 tablet Bid #[id number] ref x 3 sent to pharmacy

## 2023-04-14 NOTE — Telephone Encounter (Signed)
Left voice mail letting patient know his prescription for Sherryll Burger was sent to [facility] in [place] per the patient request.

## 2023-04-14 NOTE — Telephone Encounter (Signed)
Patient needs rx sent to [facility] on [address] for [employer]. Please call patient [phone]. Patient states we gave him samples and they have done a great job keeping his swelling down.  Thank you

## 2023-04-21 DIAGNOSIS — M170 Bilateral primary osteoarthritis of knee: Secondary | ICD-10-CM | POA: Diagnosis not present

## 2023-04-21 DIAGNOSIS — M25562 Pain in left knee: Secondary | ICD-10-CM | POA: Diagnosis not present

## 2023-04-21 DIAGNOSIS — M25561 Pain in right knee: Secondary | ICD-10-CM | POA: Diagnosis not present

## 2023-04-21 DIAGNOSIS — G8929 Other chronic pain: Secondary | ICD-10-CM | POA: Diagnosis not present

## 2023-05-03 DIAGNOSIS — K5900 Constipation, unspecified: Secondary | ICD-10-CM | POA: Diagnosis not present

## 2023-05-03 DIAGNOSIS — K219 Gastro-esophageal reflux disease without esophagitis: Secondary | ICD-10-CM | POA: Diagnosis not present

## 2023-05-03 DIAGNOSIS — R9431 Abnormal electrocardiogram [ECG] [EKG]: Secondary | ICD-10-CM | POA: Diagnosis not present

## 2023-05-03 DIAGNOSIS — N3091 Cystitis, unspecified with hematuria: Secondary | ICD-10-CM | POA: Diagnosis not present

## 2023-05-03 DIAGNOSIS — R300 Dysuria: Secondary | ICD-10-CM | POA: Diagnosis not present

## 2023-05-20 DIAGNOSIS — L03039 Cellulitis of unspecified toe: Secondary | ICD-10-CM | POA: Diagnosis not present

## 2023-09-01 DIAGNOSIS — R350 Frequency of micturition: Secondary | ICD-10-CM | POA: Diagnosis not present

## 2023-09-01 DIAGNOSIS — R52 Pain, unspecified: Secondary | ICD-10-CM | POA: Diagnosis not present

## 2023-09-03 ENCOUNTER — Encounter: Payer: Self-pay | Admitting: Cardiology

## 2023-09-04 DIAGNOSIS — R21 Rash and other nonspecific skin eruption: Secondary | ICD-10-CM | POA: Diagnosis not present

## 2023-09-07 ENCOUNTER — Encounter: Payer: Self-pay | Admitting: Cardiology

## 2023-09-07 ENCOUNTER — Ambulatory Visit: Payer: Medicare Other | Attending: Cardiology | Admitting: Cardiology

## 2023-09-07 ENCOUNTER — Ambulatory Visit: Payer: Medicare Other | Attending: Cardiology

## 2023-09-07 VITALS — BP 122/70 | HR 84 | Ht 71.0 in | Wt 244.0 lb

## 2023-09-07 DIAGNOSIS — I4891 Unspecified atrial fibrillation: Secondary | ICD-10-CM

## 2023-09-07 DIAGNOSIS — E1169 Type 2 diabetes mellitus with other specified complication: Secondary | ICD-10-CM | POA: Diagnosis not present

## 2023-09-07 DIAGNOSIS — G4733 Obstructive sleep apnea (adult) (pediatric): Secondary | ICD-10-CM

## 2023-09-07 DIAGNOSIS — J441 Chronic obstructive pulmonary disease with (acute) exacerbation: Secondary | ICD-10-CM

## 2023-09-07 DIAGNOSIS — I25110 Atherosclerotic heart disease of native coronary artery with unstable angina pectoris: Secondary | ICD-10-CM

## 2023-09-07 DIAGNOSIS — E669 Obesity, unspecified: Secondary | ICD-10-CM

## 2023-09-07 DIAGNOSIS — I10 Essential (primary) hypertension: Secondary | ICD-10-CM | POA: Diagnosis not present

## 2023-09-07 DIAGNOSIS — E782 Mixed hyperlipidemia: Secondary | ICD-10-CM | POA: Diagnosis not present

## 2023-09-07 NOTE — Progress Notes (Signed)
Cardiology Office Note:    Date:  09/07/2023   ID:  Lance Shepherd, DOB 03-18-1946, MRN [id number]  PCP:  [name], MD  Cardiologist:  Gypsy Balsam, MD    Referring MD: [name], MD   Chief Complaint  Patient presents with   Follow-up    Follow up Urgent care - said he was in A-fib    History of Present Illness:    Lance Shepherd is a 77 y.o. male   with past medical history significant for coronary artery disease, status post PTCA and stenting of the right coronary artery and circumflex done in November 2016, then later stenting of obtuse marginal branch done in April 2019, diabetes, hyperlipidemia, hypertension, COPD.  Most recently in August 29, 2021 he presented to the hospital with COVID after being evaluated in the emergency started complaining of having chest pain.  He was find to have non-STEMI cardiac catheterization performed on 27 December showed 99% stenosis of large obtuse marginal branch treated with PTCA and stenting with drug-eluting stent.  Also he was noted to have moderate RCA stenosis as well as moderate diffuse proximal LAD stenosis.  Comes today to months for follow-up overall doing fair.  He just recently developed shingles.  Apparently with the right arm pain he went to urgent care.  Somebody listen to his heart told him he is in atrial fibrillation but no EKG being done.  This is fairly frustrating.  Today he is in normal rhythm.  Denies have any chest pain tightness squeezing pressure burning chest.  Right arm right hand still hurting still got some changes because of shingles.  Past Medical History:  Diagnosis Date   Acute neck pain 12/21/2019   Allergic rhinitis 11/16/2018   Behavior disturbance 04/10/2020   Benign prostatic hyperplasia without lower urinary tract symptoms 10/09/2015   Cervical discitis 01/01/2020   Chronic obstructive pulmonary disease (HCC) 10/09/2015   Chronic pain of left knee 06/16/2017   Constipation, chronic 04/10/2020    Coronary arteriosclerosis in native artery 08/07/2016   Coronary artery disease stent to RCA and circumflex in November 2017, stent to obtuse marginal branch in 2019 08/07/2016   Diabetes mellitus (HCC) 01/19/2020   Diabetes mellitus type 2 in obese 09/13/2019   HTN (hypertension) 01/19/2020   Hyperlipidemia 10/09/2015   Hypertensive heart disease with heart failure (HCC) 10/09/2015   Long-term insulin use (HCC) 08/12/2018   MCI (mild cognitive impairment) 09/13/2019   Medication monitoring encounter 03/01/2020   Mild anxiety 03/14/2016   NSTEMI (non-ST elevated myocardial infarction) (HCC) 09/01/2021   NSTEMI, initial episode of care [facility]) 12/25/2017   Formatting of this note might be different from the original. 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 C5-6   Primary osteoarthritis of right knee 06/23/2017   Sinus tachycardia by electrocardiogram 11/11/2019   Spondylosis of cervical region without myelopathy or radiculopathy 12/21/2019   Statin intolerance 09/13/2019   Statin myopathy 08/20/2020   Status post coronary artery stent placement 01/19/2018    Past Surgical History:  Procedure Laterality Date   cardiacstent placement     COLONOSCOPY     CORONARY ANGIOGRAPHY N/A 09/03/2021   Procedure: CORONARY ANGIOGRAPHY;  Surgeon: Tonny Bollman, MD;  Location: [facility] INVASIVE CV LAB;  Service: Cardiovascular;  Laterality: N/A;  CORONARY STENT INTERVENTION N/A 09/03/2021   Procedure: CORONARY STENT INTERVENTION;  Surgeon: Tonny Bollman, MD;  Location: [facility] INVASIVE CV LAB;  Service: Cardiovascular;  Laterality: N/A;   hernia repaired     stent replacement      Current Medications: Current Meds  Medication Sig   MOTEGRITY 2 MG TABS Take 2 mg by mouth daily.    omeprazole (PRILOSEC) 20 MG capsule Take 20 mg by mouth daily.     Allergies:   Ticagrelor, Metformin and related, Pioglitazone, and Statins   Social History   Socioeconomic History   Marital status: Married    Spouse name: Not on file   Number of children: Not on file   Years of education: Not on file   Highest education level: Not on file  Occupational History   Not on file  Tobacco Use   Smoking status: Never   Smokeless tobacco: Never  Substance and Sexual Activity   Alcohol use: Never   Drug use: Never   Sexual activity: Not Currently  Other Topics Concern   Not on file  Social History Narrative   Not on file   Social Drivers of Health   Financial Resource Strain: Not on file  Food Insecurity: Not on file  Transportation Needs: Not on file  Physical Activity: Not on file  Stress: Not on file  Social Connections: Not on file     Family History: The patient's family history includes COPD in his mother; Dementia in his mother; Lung cancer in his father. ROS:   Please see the history of present illness.    All 14 point review of systems negative except as described per history of present illness  EKGs/Labs/Other Studies Reviewed:    EKG Interpretation Date/Time:  Monday September 07 2023 11:26:22 EST Ventricular Rate:  80 PR Interval:  188 QRS Duration:  140 QT Interval:  412 QTC Calculation: 475 R Axis:   109  Text Interpretation: Normal sinus rhythm Right bundle branch block Abnormal ECG When compared with ECG of 04-Sep-2021 06:25, No significant change was found Confirmed by Gypsy Balsam [phone]) on 09/07/2023 11:37:59 AM    Recent Labs: 12/31/2022: NT-Pro BNP 192 01/30/2023: BUN 12; Creatinine, Ser 1.12; Potassium 4.5; Sodium 137  Recent Lipid Panel    Component Value Date/Time   CHOL 103 11/21/2021 1400   TRIG 138 11/21/2021 1400   HDL 39 (L) 11/21/2021 1400   CHOLHDL 2.6 11/21/2021 1400   CHOLHDL 5.7 09/05/2021 0145   VLDL 18 09/05/2021 0145    LDLCALC 40 11/21/2021 1400    Physical Exam:    VS:  BP 122/70 (BP Location: Left Arm, Patient Position: Sitting)   Pulse 84   Ht 5\' 11"  (1.803 m)   Wt 244 lb (110.7 kg)   SpO2 96%   BMI 34.03 kg/m     Wt Readings from Last 3 Encounters:  09/07/23 244 lb (110.7 kg)  12/31/22 241 lb 3.2 oz (109.4 kg)  10/01/22 239 lb 12.8 oz (108.8 kg)     GEN:  Well nourished, well developed in no acute distress HEENT: Normal NECK: No JVD; No carotid bruits LYMPHATICS: No lymphadenopathy CARDIAC: RRR, no murmurs, no rubs, no gallops RESPIRATORY:  Clear to auscultation without rales, wheezing or rhonchi  ABDOMEN: Soft, non-tender, non-distended MUSCULOSKELETAL:  No edema; No deformity  SKIN: Warm and dry LOWER EXTREMITIES: no swelling NEUROLOGIC:  Alert and oriented x 3 PSYCHIATRIC:  Normal affect   ASSESSMENT:    1. Atrial fibrillation, unspecified type (  HCC)   2. Coronary artery disease involving native heart with unstable angina pectoris, unspecified vessel or lesion type (HCC)   3. Primary hypertension   4. Obstructive sleep apnea syndrome   5. Chronic obstructive pulmonary disease with acute exacerbation (HCC)   6. Type 2 diabetes mellitus with obesity (HCC)   7. Mixed hyperlipidemia    PLAN:    In order of problems listed above:  Atrial fibrillation never documented there was an opportunity to catch it when he was in urgent care but no EKG has been done I will ask him to wear Zio patch for 2 weeks to see if you have an episode of atrial fibrillation obviously if that is the case we will aggressively treat with anticoagulation. Coronary disease stable without any symptoms that we will indicate reactivation of the problem. Essential hypertension blood pressure well-controlled continue present management. Dyslipidemia I did review K PN which show me LDL 40 HDL 33 will continue present management. Type 2 diabetes followed by primary care physician with hemoglobin A1c 12.0 this is  from February clearly very unacceptable he is working hard with primary care physician to get it better.   Medication Adjustments/Labs and Tests Ordered: Current medicines are reviewed at length with the patient today.  Concerns regarding medicines are outlined above.  Orders Placed This Encounter  Procedures   LONG TERM MONITOR (3-14 DAYS)   EKG 12-Lead   Medication changes: No orders of the defined types were placed in this encounter.   Signed, Georgeanna Lea, MD, [facility] 09/07/2023 12:08 PM    [facility]

## 2023-09-07 NOTE — Patient Instructions (Signed)
Medication Instructions:  Your physician recommends that you continue on your current medications as directed. Please refer to the Current Medication list given to you today.  *If you need a refill on your cardiac medications before your next appointment, please call your pharmacy*   Lab Work: None Ordered If you have labs (blood work) drawn today and your tests are completely normal, you will receive your results only by: MyChart Message (if you have MyChart) OR A paper copy in the mail If you have any lab test that is abnormal or we need to change your treatment, we will call you to review the results.   Testing/Procedures:  WHY IS MY DOCTOR PRESCRIBING ZIO? The Zio system is proven and trusted by physicians to detect and diagnose irregular heart rhythms -- and has been prescribed to hundreds of thousands of patients.  The FDA has cleared the Zio system to monitor for many different kinds of irregular heart rhythms. In a study, physicians were able to reach a diagnosis 90% of the time with the Zio system1.  You can wear the Zio monitor -- a small, discreet, comfortable patch -- during your normal day-to-day activity, including while you sleep, shower, and exercise, while it records every single heartbeat for analysis.  1Barrett, P., et al. Comparison of 24 Hour Holter Monitoring Versus 14 Day Novel Adhesive Patch Electrocardiographic Monitoring. American Journal of Medicine, 2014.  ZIO VS. HOLTER MONITORING The Zio monitor can be comfortably worn for up to 14 days. Holter monitors can be worn for 24 to 48 hours, limiting the time to record any irregular heart rhythms you may have. Zio is able to capture data for the 51% of patients who have their first symptom-triggered arrhythmia after 48 hours.1  LIVE WITHOUT RESTRICTIONS The Zio ambulatory cardiac monitor is a small, unobtrusive, and water-resistant patch--you might even forget you're wearing it. The Zio monitor records and stores  every beat of your heart, whether you're sleeping, working out, or showering.     Follow-Up: At [facility], you and your health needs are our priority.  As part of our continuing mission to provide you with exceptional heart care, we have created designated Provider Care Teams.  These Care Teams include your primary Cardiologist (physician) and Advanced Practice Providers (APPs -  Physician Assistants and Nurse Practitioners) who all work together to provide you with the care you need, when you need it.  We recommend signing up for the patient portal called "MyChart".  Sign up information is provided on this After Visit Summary.  MyChart is used to connect with patients for Virtual Visits (Telemedicine).  Patients are able to view lab/test results, encounter notes, upcoming appointments, etc.  Non-urgent messages can be sent to your provider as well.   To learn more about what you can do with MyChart, go to [email or web address].    Your next appointment:   3 month(s)  The format for your next appointment:   In Person  Provider:   Robert Krasowski, MD    Other Instructions NA  

## 2023-10-06 DIAGNOSIS — R0981 Nasal congestion: Secondary | ICD-10-CM | POA: Diagnosis not present

## 2023-10-06 DIAGNOSIS — M791 Myalgia, unspecified site: Secondary | ICD-10-CM | POA: Diagnosis not present

## 2023-10-08 ENCOUNTER — Telehealth: Payer: Self-pay | Admitting: *Deleted

## 2023-10-08 NOTE — Telephone Encounter (Signed)
Left message for pt to call back about monitor; it is over due to be returned.

## 2023-10-29 NOTE — Telephone Encounter (Signed)
Left message on machine for pt to send back monitor and if any questions to call us [phone]

## 2023-11-12 IMAGING — DX DG CHEST 1V PORT
1 series · 1 of 1 positions shown · non-contrast
Comparison: Portable exam 4581 hours compared to 12/18/2019

CLINICAL DATA: Chest pain and nausea/vomiting since yesterday,
hemodynamically stable, symptoms responded to nitroglycerin
yesterday but not today

EXAM:
PORTABLE CHEST 1 VIEW

[chest ap]
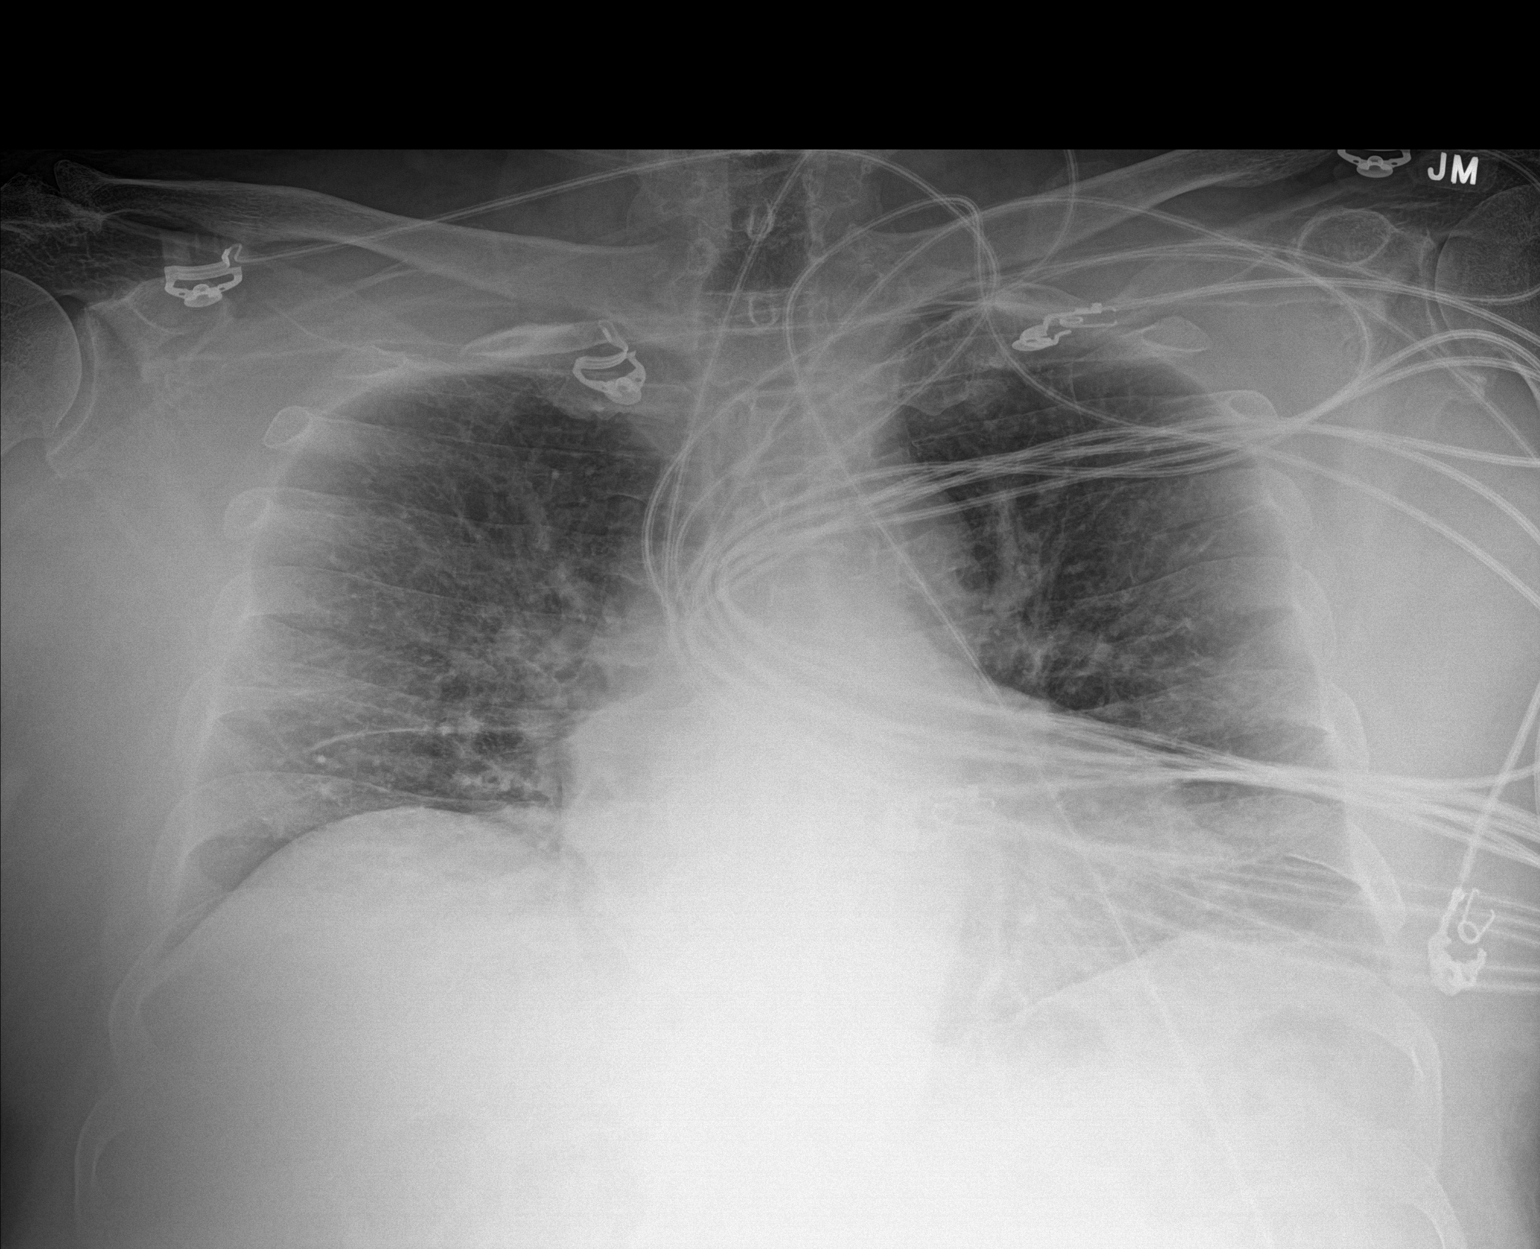

[1 of 1 positions shown; findings below may reference images not displayed]

FINDINGS: Minimal enlargement of cardiac silhouette with pulmonary vascular
congestion.

Mediastinal contours normal.

Lordotic positioning.

Chronic accentuation of RIGHT perihilar markings.

No definite acute infiltrate, pleural effusion, or pneumothorax.

Bones demineralized.
IMPRESSION: Enlargement of cardiac silhouette with pulmonary vascular
congestion.

No definite acute abnormalities.

## 2023-12-03 DIAGNOSIS — M170 Bilateral primary osteoarthritis of knee: Secondary | ICD-10-CM | POA: Diagnosis not present

## 2023-12-08 ENCOUNTER — Ambulatory Visit: Payer: Medicare Other | Attending: Cardiology | Admitting: Cardiology

## 2023-12-08 ENCOUNTER — Encounter: Payer: Self-pay | Admitting: Cardiology

## 2023-12-08 VITALS — BP 142/84 | HR 90 | Ht 71.0 in | Wt 250.6 lb

## 2023-12-08 DIAGNOSIS — E1169 Type 2 diabetes mellitus with other specified complication: Secondary | ICD-10-CM | POA: Diagnosis not present

## 2023-12-08 DIAGNOSIS — I10 Essential (primary) hypertension: Secondary | ICD-10-CM

## 2023-12-08 DIAGNOSIS — Z789 Other specified health status: Secondary | ICD-10-CM

## 2023-12-08 DIAGNOSIS — I25110 Atherosclerotic heart disease of native coronary artery with unstable angina pectoris: Secondary | ICD-10-CM

## 2023-12-08 DIAGNOSIS — R0609 Other forms of dyspnea: Secondary | ICD-10-CM

## 2023-12-08 DIAGNOSIS — E669 Obesity, unspecified: Secondary | ICD-10-CM

## 2023-12-08 DIAGNOSIS — R41841 Cognitive communication deficit: Secondary | ICD-10-CM

## 2023-12-08 DIAGNOSIS — G4733 Obstructive sleep apnea (adult) (pediatric): Secondary | ICD-10-CM

## 2023-12-08 NOTE — Progress Notes (Signed)
 Cardiology Office Note:    Date:  12/08/2023   ID:  Lance Shepherd, DOB 02-24-1946, MRN [id number]  PCP:  [name], MD  Cardiologist:  Gypsy Balsam, MD    Referring MD: [name], MD   Chief Complaint  Patient presents with   Follow-up    History of Present Illness:    Lance Shepherd is a 78 y.o. male   with past medical history significant for coronary artery disease, status post PTCA and stenting of the right coronary artery and circumflex done in November 2016, then later stenting of obtuse marginal branch done in April 2019, diabetes, hyperlipidemia, hypertension, COPD.  Most recently in August 29, 2021 he presented to the hospital with COVID after being evaluated in the emergency started complaining of having chest pain.  He was find to have non-STEMI cardiac catheterization performed on 27 December showed 99% stenosis of large obtuse marginal branch treated with PTCA and stenting with drug-eluting stent.  Also he was noted to have moderate RCA stenosis as well as moderate diffuse proximal LAD stenosis.  Comes today to months for follow-up he did notice some swelling of lower extremities.  It happens pressure and evening.  He is very hard of hearing to conversation with him is very difficult denies have any chest pain tightness squeezing pressure burning chest  Past Medical History:  Diagnosis Date   Acute neck pain 12/21/2019   Allergic rhinitis 11/16/2018   Behavior disturbance 04/10/2020   Benign prostatic hyperplasia without lower urinary tract symptoms 10/09/2015   Cervical discitis 01/01/2020   Chronic obstructive pulmonary disease (HCC) 10/09/2015   Chronic pain of left knee 06/16/2017   Constipation, chronic 04/10/2020   Coronary arteriosclerosis in native artery 08/07/2016   Coronary artery disease stent to RCA and circumflex in November 2017, stent to obtuse marginal branch in 2019 08/07/2016   Diabetes mellitus (HCC) 01/19/2020   Diabetes mellitus type 2  in obese 09/13/2019   HTN (hypertension) 01/19/2020   Hyperlipidemia 10/09/2015   Hypertensive heart disease with heart failure (HCC) 10/09/2015   Long-term insulin use (HCC) 08/12/2018   MCI (mild cognitive impairment) 09/13/2019   Medication monitoring encounter 03/01/2020   Mild anxiety 03/14/2016   NSTEMI (non-ST elevated myocardial infarction) (HCC) 09/01/2021   NSTEMI, initial episode of care [facility]) 12/25/2017   Formatting of this note might be different from the original. 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 C5-6   Primary osteoarthritis of right knee 06/23/2017   Sinus tachycardia by electrocardiogram 11/11/2019   Spondylosis of cervical region without myelopathy or radiculopathy 12/21/2019   Statin intolerance 09/13/2019   Statin myopathy 08/20/2020   Status post coronary artery stent placement 01/19/2018    Past Surgical History:  Procedure Laterality Date   cardiacstent placement     COLONOSCOPY     CORONARY ANGIOGRAPHY N/A 09/03/2021   Procedure: CORONARY ANGIOGRAPHY;  Surgeon: Tonny Bollman, MD;  Location: [facility] INVASIVE CV LAB;  Service: Cardiovascular;  Laterality: N/A;   CORONARY STENT INTERVENTION N/A 09/03/2021   Procedure: CORONARY STENT INTERVENTION;  Surgeon: Tonny Bollman, MD;  Location: [facility] INVASIVE CV LAB;  Service: Cardiovascular;  Laterality: N/A;   hernia repaired     stent replacement      Current Medications: Current Meds  Medication Sig  aspirin 81 MG EC tablet Take 1 tablet (81 mg total) by mouth daily. Swallow whole.   clopidogrel (PLAVIX) 75 MG tablet Take 1 tablet (75 mg total) by mouth daily with breakfast.   Evolocumab (REPATHA SURECLICK) 140 MG/ML SOAJ Inject 1 pen  into the skin every 14 (fourteen) days.   fluticasone  (FLONASE) 50 MCG/ACT nasal spray Place 1 spray into both nostrils daily.   Fluticasone-Salmeterol (ADVAIR) 250-50 MCG/DOSE AEPB Inhale 1 puff into the lungs as needed for shortness of breath.   furosemide (LASIX) 20 MG tablet Take 20 mg by mouth as needed for fluid or edema.   insulin NPH-regular Human (70-30) 100 UNIT/ML injection Inject 100 Units into the skin 2 (two) times daily with a meal.   metoprolol tartrate (LOPRESSOR) 50 MG tablet Take 1 tablet (50 mg total) by mouth 2 (two) times daily.   MOTEGRITY 2 MG TABS Take 2 mg by mouth daily.   nitroGLYCERIN (NITROSTAT) 0.4 MG SL tablet Place 1 tablet (0.4 mg total) under the tongue every 5 (five) minutes as needed for chest pain.   omeprazole (PRILOSEC) 20 MG capsule Take 20 mg by mouth daily.   sacubitril-valsartan (ENTRESTO) 24-26 MG Take 1 tablet by mouth 2 (two) times daily.   tamsulosin (FLOMAX) 0.4 MG CAPS capsule Take 0.4 mg by mouth daily.   valACYclovir (VALTREX) 1000 MG tablet Take 1,000 mg by mouth every 8 (eight) hours.     Allergies:   Ticagrelor, Metformin and related, Pioglitazone, and Statins   Social History   Socioeconomic History   Marital status: Married    Spouse name: Not on file   Number of children: Not on file   Years of education: Not on file   Highest education level: Not on file  Occupational History   Not on file  Tobacco Use   Smoking status: Never   Smokeless tobacco: Never  Substance and Sexual Activity   Alcohol use: Never   Drug use: Never   Sexual activity: Not Currently  Other Topics Concern   Not on file  Social History Narrative   Not on file   Social Drivers of Health   Financial Resource Strain: Not on file  Food Insecurity: Not on file  Transportation Needs: Not on file  Physical Activity: Not on file  Stress: Not on file  Social Connections: Not on file     Family History: The patient's family history includes COPD in his mother; Dementia in his mother; Lung cancer in his  father. ROS:   Please see the history of present illness.    All 14 point review of systems negative except as described per history of present illness  EKGs/Labs/Other Studies Reviewed:         Recent Labs: 12/31/2022: NT-Pro BNP 192 01/30/2023: BUN 12; Creatinine, Ser 1.12; Potassium 4.5; Sodium 137  Recent Lipid Panel    Component Value Date/Time   CHOL 103 11/21/2021 1400   TRIG 138 11/21/2021 1400   HDL 39 (L) 11/21/2021 1400   CHOLHDL 2.6 11/21/2021 1400   CHOLHDL 5.7 09/05/2021 0145   VLDL 18 09/05/2021 0145   LDLCALC 40 11/21/2021 1400    Physical Exam:    VS:  BP (!) 142/84 (BP Location: Right Arm, Patient Position: Sitting)   Pulse 90   Ht 5\' 11"  (1.803 m)   Wt 250 lb 9.6 oz (113.7 kg)   SpO2 95%   BMI 34.95 kg/m     Wt Readings from Last 3 Encounters:  12/08/23 250 lb 9.6 oz (113.7 kg)  09/07/23 244 lb (110.7 kg)  12/31/22 241 lb 3.2 oz (109.4 kg)     GEN:  Well nourished, well developed in no acute distress HEENT: Normal NECK: No JVD; No carotid bruits LYMPHATICS: No lymphadenopathy CARDIAC: RRR, no murmurs, no rubs, no gallops RESPIRATORY:  Clear to auscultation without rales, wheezing or rhonchi  ABDOMEN: Soft, non-tender, non-distended MUSCULOSKELETAL:  No edema; No deformity  SKIN: Warm and dry LOWER EXTREMITIES: no swelling NEUROLOGIC:  Alert and oriented x 3 PSYCHIATRIC:  Normal affect   ASSESSMENT:    1. Coronary artery disease involving native heart with unstable angina pectoris, unspecified vessel or lesion type (HCC)   2. Primary hypertension   3. Obstructive sleep apnea syndrome   4. Type 2 diabetes mellitus with obesity (HCC)   5. Cognitive communication disorder   6. Statin intolerance    PLAN:    In order of problems listed above:  Coronary disease stable from that point he is on guideline directed medical therapy which I will continue. Essential hypertension he admits that he did not take all his medications today he for  example did not take his Entresto I stressed importance of taking all this medication on the regular basis hopefully he will be compliant. Type 2 diabetes I do have hemoglobin A1c which is from February of last year was 12.0 obviously acutely unacceptable.  Will recheck hemoglobin A1c. Dyslipidemia I do have last number from last year which was impacted with LDL of 40.  He is taking Repatha but he tells me today that he got few of the syringes in the fridge so ask him if he uses that he said no he stopped it few months ago with no particular reason so asking to go back on Repatha we will recheck his fasting lipid profile in about 2 months or so.   Medication Adjustments/Labs and Tests Ordered: Current medicines are reviewed at length with the patient today.  Concerns regarding medicines are outlined above.  No orders of the defined types were placed in this encounter.  Medication changes: No orders of the defined types were placed in this encounter.   Signed, Georgeanna Lea, MD, [facility] 12/08/2023 2:48 PM    [facility]

## 2023-12-08 NOTE — Patient Instructions (Signed)
 Medication Instructions:  Your physician recommends that you continue on your current medications as directed. Please refer to the Current Medication list given to you today.  *If you need a refill on your cardiac medications before your next appointment, please call your pharmacy*   Lab Work: Your physician recommends that you return for lab work in: 2 months You need to have labs done when you are fasting.  You can come Monday through Friday 8:30 am to 12:00 pm and 1:15 to 4:30. You do not need to make an appointment as the order has already been placed. The labs you are going to have done are  Lipids.    Testing/Procedures: Your physician has requested that you have an echocardiogram. Echocardiography is a painless test that uses sound waves to create images of your heart. It provides your doctor with information about the size and shape of your heart and how well your heart's chambers and valves are working. This procedure takes approximately one hour. There are no restrictions for this procedure. Please do NOT wear cologne, perfume, aftershave, or lotions (deodorant is allowed). Please arrive 15 minutes prior to your appointment time.  Please note: We ask at that you not bring children with you during ultrasound (echo/ vascular) testing. Due to room size and safety concerns, children are not allowed in the ultrasound rooms during exams. Our front office staff cannot provide observation of children in our lobby area while testing is being conducted. An adult accompanying a patient to their appointment will only be allowed in the ultrasound room at the discretion of the ultrasound technician under special circumstances. We apologize for any inconvenience.    Follow-Up: At [facility], you and your health needs are our priority.  As part of our continuing mission to provide you with exceptional heart care, we have created designated Provider Care Teams.  These Care Teams include your primary  Cardiologist (physician) and Advanced Practice Providers (APPs -  Physician Assistants and Nurse Practitioners) who all work together to provide you with the care you need, when you need it.  We recommend signing up for the patient portal called "MyChart".  Sign up information is provided on this After Visit Summary.  MyChart is used to connect with patients for Virtual Visits (Telemedicine).  Patients are able to view lab/test results, encounter notes, upcoming appointments, etc.  Non-urgent messages can be sent to your provider as well.   To learn more about what you can do with MyChart, go to [email or web address].    Your next appointment:   6 month(s)  The format for your next appointment:   In Person  Provider:   Gypsy Balsam, MD    Other Instructions NA

## 2023-12-08 NOTE — Addendum Note (Signed)
 Addended by: Baldo Ash D on: 12/08/2023 02:58 PM   Modules accepted: Orders

## 2023-12-09 ENCOUNTER — Telehealth: Payer: Self-pay | Admitting: Pharmacy Technician

## 2023-12-09 ENCOUNTER — Other Ambulatory Visit (HOSPITAL_COMMUNITY): Payer: Self-pay

## 2023-12-09 NOTE — Telephone Encounter (Signed)
 Patient Advocate Encounter   The patient was approved for a [employer] grant that will help cover the cost of Entresto Total amount awarded, $10,000.00.  Effective: 11/09/23 - 11/07/24   [id number] [id number]  [employer] ID: [id number]   Pharmacy provided with approval and processing information. Patient informed via phone- left VM

## 2023-12-30 ENCOUNTER — Ambulatory Visit: Attending: Cardiology

## 2023-12-30 DIAGNOSIS — R0609 Other forms of dyspnea: Secondary | ICD-10-CM

## 2023-12-30 MED ORDER — PERFLUTREN LIPID MICROSPHERE
1.0000 mL | INTRAVENOUS | Status: AC | PRN
  Administered 2023-12-30: 10 mL via INTRAVENOUS

## 2023-12-31 LAB — ECHOCARDIOGRAM COMPLETE
AR max vel: 2.47 cm2
AV Area VTI: 2.54 cm2
AV Area mean vel: 2.44 cm2
AV Mean grad: 3.9 mmHg
AV Peak grad: 7.4 mmHg
Ao pk vel: 1.36 m/s
Area-P 1/2: 4.96 cm2
S' Lateral: 3.9 cm

## 2024-01-20 ENCOUNTER — Ambulatory Visit: Payer: Self-pay

## 2024-01-20 NOTE — Telephone Encounter (Signed)
Patient returned my call and was notified of results.  

## 2024-01-20 NOTE — Telephone Encounter (Signed)
 LM to return my call.

## 2024-01-20 NOTE — Telephone Encounter (Signed)
-----   Message from Ralene Burger sent at 01/08/2024  9:42 AM EDT ----- Echocardiogram show ejection fraction being lower limits of normal moderate degree of left ventricle hypertrophy meaning thickening of the wall of the heart everything else looks good

## 2024-02-05 DIAGNOSIS — E1165 Type 2 diabetes mellitus with hyperglycemia: Secondary | ICD-10-CM | POA: Diagnosis not present

## 2024-02-05 DIAGNOSIS — I10 Essential (primary) hypertension: Secondary | ICD-10-CM | POA: Diagnosis not present

## 2024-02-05 DIAGNOSIS — Z0000 Encounter for general adult medical examination without abnormal findings: Secondary | ICD-10-CM | POA: Diagnosis not present

## 2024-02-05 DIAGNOSIS — R0981 Nasal congestion: Secondary | ICD-10-CM | POA: Diagnosis not present

## 2024-02-05 LAB — LAB REPORT - SCANNED: A1c: 11.5

## 2024-02-24 DIAGNOSIS — R600 Localized edema: Secondary | ICD-10-CM | POA: Diagnosis not present

## 2024-02-24 DIAGNOSIS — R0981 Nasal congestion: Secondary | ICD-10-CM | POA: Diagnosis not present

## 2024-02-24 DIAGNOSIS — J449 Chronic obstructive pulmonary disease, unspecified: Secondary | ICD-10-CM | POA: Diagnosis not present

## 2024-03-01 NOTE — Progress Notes (Signed)
 " Cardiology Office Note   Date:  03/02/2024  ID:  Lance Shepherd, DOB [birth year], MRN [id number] PCP: Ina Marcellus RAMAN, MD   HeartCare Providers Cardiologist:  Lamar Fitch, MD     History of Present Illness LONN IM is a 78 y.o. male with a past medical history of CAD with interventions listed below, HTN, COPD, OSA, DM2, hypothyroidism, dyslipidemia with statin intolerance.   12/31/2023 echo EF 50 to 55%, global hypokinesis, moderate LVH, moderate thickening of the aortic valve 01/14/2023 echo EF 45 to 50%, moderate LVH, grade 1 DD 10/01/2022 monitor average heart rate 96 bpm, 1 run of SVT 09/11/2021 echo EF 55 to 60%, grade 1 DD 09/03/2021 cardiac catheterization subtotal occlusion of large OM1 DES x 1 09/03/2021 echo EF 50 to 55%, mild calcification of the aortic valve 09/19/2020 echo EF 60-65%, grade 1 DD 09/11/2020 Lexiscan  small defect of mild severity, low risk study 2019 PTCA and DES to obtuse marginal 2017 PTCA and DES to RCA and circumflex  He establish care with Dr. Fitch in 2021 to establish care.  In 2017 he underwent PTCA DES to the RCA and circumflex, 2019 PTCA and DES to his obtuse marginal.  In 2022 he underwent a Lexiscan  revealing a small defect of mild severity however low risk study.  In 2022 he underwent left heart cath revealing subtotal occlusion of large OM1 with DES x 1.  Most recently he was evaluated by Dr. Fitch on 12/08/2023 for evaluation of pedal edema, repeat echocardiogram was arranged revealing EF of 50 to 55%, global hypokinesis and moderate thickening of his aortic valve.  He presents today at the behest of his PCP for evaluation of pedal edema and shortness of breath.  He says this has been going on for a few months, he had traditionally been taking Lasix  as needed for edema however he has been taking it daily without any change in his weight.  He reports that he has had 8 pound weight gain in the last few weeks. He denies chest pain,  palpitations, pnd, orthopnea, n, v, dizziness, syncope, edema, or early satiety.    ROS: Review of Systems  Respiratory:  Positive for shortness of breath.   Cardiovascular:  Positive for leg swelling.  All other systems reviewed and are negative.    Studies Reviewed     Cardiac Studies & Procedures   ______________________________________________________________________________________________ CARDIAC CATHETERIZATION  CARDIAC CATHETERIZATION 09/03/2021  Conclusion   Prox RCA lesion is 50% stenosed.   Dist RCA lesion is 70% stenosed.   1st Mrg lesion is 99% stenosed.   Prox LAD to Mid LAD lesion is 50% stenosed.   A drug-eluting stent was successfully placed using a SYNERGY XD 3.0X20.   Post intervention, there is a 0% residual stenosis.  Subtotal occlusion of a large OM 1 branch, treated successfully with PCI using a 3.0x20 mm Synergy DES, improving the lesion from 99--->0%, TIMI-2---->TIMI-3 flow Moderate segmental RCA stenosis Moderate diffuse proximal LAD stenosis  Recommend: DAPT with ASA and plavix  minimum 12 months, aggressive risk reduction measures, medical therapy for residual CAD. If recurrent angina, could assess RCA and LAD for intervention as needed.  Findings Coronary Findings Diagnostic  Dominance: Right  Left Anterior Descending Prox LAD to Mid LAD lesion is 50% stenosed. The lesion is calcified.  Left Circumflex  First Obtuse Marginal Branch 1st Mrg lesion is 99% stenosed. TIMI-2 flow  Right Coronary Artery Prox RCA lesion is 50% stenosed. Dist RCA lesion is 70% stenosed.  Intervention  1st Mrg lesion Stent Pre-stent angioplasty was performed using a BALLN SAPPHIRE Staley 3.25X15. A drug-eluting stent was successfully placed using a SYNERGY XD 3.0X20. Post-stent angioplasty was performed using a BALLN SAPPHIRE Graves 3.25X15. Maximum pressure:  16 atm. Post-Intervention Lesion Assessment The intervention was successful. Pre-interventional TIMI flow is 2.  Post-intervention TIMI flow is 3. No complications occurred at this lesion. There is a 0% residual stenosis post intervention.   STRESS TESTS  MYOCARDIAL PERFUSION IMAGING 09/11/2020  Narrative  Nuclear stress EF: 60%.  The left ventricular ejection fraction is normal (55-65%).  There was no ST segment deviation noted during stress.  Defect 1: There is a small defect of mild severity present in the basal inferior and apical lateral location.  This is a low risk study.  No evidence of ischemia or MI. Diaphragmatic attenuation noted.   ECHOCARDIOGRAM  ECHOCARDIOGRAM COMPLETE 12/30/2023  Narrative ECHOCARDIOGRAM REPORT    Patient Name:   Lance Shepherd Date of Exam: 12/30/2023 Medical Rec #:  [id number]    Height:       71.0 in Accession #:    [id number]   Weight:       250.6 lb Date of Birth:  1946/04/08    BSA:          2.321 m Patient Age:    32 years     BP:           142/84 mmHg Patient Gender: M            HR:           92 bpm. Exam Location:    Procedure: 2D Echo, Cardiac Doppler, Color Doppler and Intracardiac Opacification Agent (Both Spectral and Color Flow Doppler were utilized during procedure).  Indications:    Dyspnea on exertion [R06.09 (ICD-10-CM)]  History:        Patient has prior history of Echocardiogram examinations, most recent 01/14/2023. CAD, COPD, Signs/Symptoms:Chest Pain and Dyspnea; Risk Factors:Diabetes and Dyslipidemia.  Sonographer:    Charlie Jointer RDCS Referring Phys: [id number] LAMAR JINNY FITCH   Sonographer Comments: Technically difficult study due to poor echo windows. IMPRESSIONS   1. Technically difficult study, Definity  contrast used. 2. Left ventricular ejection fraction, by estimation, is 50 to 55%. The left ventricle has mildly decreased function. The left ventricle demonstrates global hypokinesis. There is moderate left ventricular hypertrophy. Left ventricular diastolic parameters are indeterminate. 3. Right  ventricular systolic function is normal. The right ventricular size is not well visualized. 4. The mitral valve was not well visualized. No evidence of mitral valve regurgitation. No evidence of mitral stenosis. 5. The aortic valve was not well visualized. There is moderate thickening of the aortic valve. Aortic valve regurgitation is not visualized. No aortic stenosis is present. Aortic valve area, by VTI measures 2.54 cm. Aortic valve mean gradient measures 3.9 mmHg. Aortic valve Vmax measures 1.36 m/s. 6. The inferior vena cava is normal in size with greater than 50% respiratory variability, suggesting right atrial pressure of 3 mmHg.  FINDINGS Left Ventricle: Left ventricular ejection fraction, by estimation, is 50 to 55%. The left ventricle has mildly decreased function. The left ventricle demonstrates global hypokinesis. Definity  contrast agent was given IV to delineate the left ventricular endocardial borders. The left ventricular internal cavity size was normal in size. There is moderate left ventricular hypertrophy. Left ventricular diastolic parameters are indeterminate.  Right Ventricle: The right ventricular size is not well visualized. Right vetricular wall thickness was not well visualized. Right  ventricular systolic function is normal.  Left Atrium: Left atrial size was normal in size.  Right Atrium: Right atrial size was not well visualized.  Pericardium: There is no evidence of pericardial effusion.  Mitral Valve: The mitral valve was not well visualized. No evidence of mitral valve regurgitation. No evidence of mitral valve stenosis.  Tricuspid Valve: The tricuspid valve is not well visualized. Tricuspid valve regurgitation is not demonstrated. No evidence of tricuspid stenosis.  Aortic Valve: The aortic valve was not well visualized. There is moderate thickening of the aortic valve. Aortic valve regurgitation is not visualized. No aortic stenosis is present. Aortic valve  mean gradient measures 3.9 mmHg. Aortic valve peak gradient measures 7.4 mmHg. Aortic valve area, by VTI measures 2.54 cm.  Pulmonic Valve: The pulmonic valve was not well visualized. Pulmonic valve regurgitation is not visualized. No evidence of pulmonic stenosis.  Aorta: The aortic root and ascending aorta are structurally normal, with no evidence of dilitation.  Venous: The inferior vena cava is normal in size with greater than 50% respiratory variability, suggesting right atrial pressure of 3 mmHg.  IAS/Shunts: The interatrial septum was not well visualized.   LEFT VENTRICLE PLAX 2D LVIDd:         5.10 cm   Diastology LVIDs:         3.90 cm   LV e' medial:    4.03 cm/s LV PW:         1.40 cm   LV E/e' medial:  19.6 LV IVS:        1.50 cm   LV e' lateral:   6.42 cm/s LVOT diam:     2.30 cm   LV E/e' lateral: 12.3 LV SV:         62 LV SV Index:   27 LVOT Area:     4.15 cm   IVC IVC diam: 1.50 cm  LEFT ATRIUM             Index        RIGHT ATRIUM           Index LA diam:        3.10 cm 1.34 cm/m   RA Area:     14.00 cm LA Vol (A2C):   47.1 ml 20.30 ml/m  RA Volume:   29.90 ml  12.88 ml/m LA Vol (A4C):   40.1 ml 17.28 ml/m LA Biplane Vol: 45.7 ml 19.69 ml/m AORTIC VALVE AV Area (Vmax):    2.47 cm AV Area (Vmean):   2.44 cm AV Area (VTI):     2.54 cm AV Vmax:           136.36 cm/s AV Vmean:          90.834 cm/s AV VTI:            0.243 m AV Peak Grad:      7.4 mmHg AV Mean Grad:      3.9 mmHg LVOT Vmax:         81.10 cm/s LVOT Vmean:        53.367 cm/s LVOT VTI:          0.148 m LVOT/AV VTI ratio: 0.61  AORTA Ao Root diam: 3.60 cm Ao Asc diam:  3.50 cm Ao Desc diam: 2.80 cm  MITRAL VALVE MV Area (PHT): 4.96 cm     SHUNTS MV Decel Time: 153 msec     Systemic VTI:  0.15 m MV E velocity: 78.90 cm/s   Systemic Diam:  2.30 cm MV A velocity: 114.00 cm/s MV E/A ratio:  0.69  Sreedhar reddy Madireddy Electronically signed by Alean reddy  Madireddy Signature Date/Time: 12/31/2023/1:34:19 PM    Final    MONITORS  LONG TERM MONITOR (3-14 DAYS) 10/16/2022  Narrative Patch Wear Time:  7 days and 0 hours (2024-01-24T08:46:58-0500 to 2024-01-31T09:03:44-0500)  Patient had a min HR of 71 bpm, max HR of 162 bpm, and avg HR of 96 bpm. Predominant underlying rhythm was Sinus Rhythm. 1 run of Supraventricular Tachycardia occurred lasting 11 beats with a max rate of 162 bpm (avg 144 bpm). Isolated SVEs were rare (<1.0%), SVE Couplets were rare (<1.0%), and SVE Triplets were rare (<1.0%). Isolated VEs were occasional (1.6%, 15290), VE Couplets were rare (<1.0%, 795), and VE Triplets were rare (<1.0%, 20).  Summary conclusions. 1 episode of supraventricular tachycardia.  Occasional ventricular ectopy total burden of 1.6%       ______________________________________________________________________________________________      Risk Assessment/Calculations           Physical Exam VS:  BP 110/70   Pulse 83   Ht 5' 11 (1.803 m)   Wt 252 lb (114.3 kg)   SpO2 93%   BMI 35.15 kg/m        Wt Readings from Last 3 Encounters:  03/02/24 252 lb (114.3 kg)  12/08/23 250 lb 9.6 oz (113.7 kg)  09/07/23 244 lb (110.7 kg)    GEN: Well nourished, well developed in no acute distress NECK: No JVD; No carotid bruits CARDIAC: RRR, no murmurs, rubs, gallops RESPIRATORY: Trace bilateral Rales ABDOMEN: Distended EXTREMITIES:  +2 pitting edema to knee; No deformity   ASSESSMENT AND PLAN CAD-continue aspirin  81 mg daily, Plavix  75 mg daily, Repatha , metoprolol  50 mg twice daily, nitroglycerin  as needed. Stable with no anginal symptoms. No indication for ischemic evaluation.    HFmrEF-continue Entresto  24-26 mg twice daily, metoprolol  50 mg twice daily, Lasix  20 mg prescribed as needed however has been taken daily without much change.  NYHA class II-III, will repeat BMET, he is clearly volume overloaded with trace rales appreciated and  worsening pedal edema, we will start him on Jardiance  10 mg daily, continue Lasix  20 mg daily for now and will repeat BMET in 2 weeks.    Hypertension-his blood pressure is well-controlled at 110/70, currently only on metoprolol .  Dyslipidemia-he states he thinks his PCP just checked this, continue Repatha . Will request recent lipid panel.   DM2 - currently on insulin , most recent A1C on file for review elevated at 12%, starting Jardiance  per above.   Dispo: BMET today, start Jardiance  10 mg daily, repeat BMET in 2 weeks. Follow up in 1 month.   Signed, Delon JAYSON Hoover, NP  "

## 2024-03-02 ENCOUNTER — Encounter: Payer: Self-pay | Admitting: Cardiology

## 2024-03-02 ENCOUNTER — Ambulatory Visit: Attending: Cardiology | Admitting: Cardiology

## 2024-03-02 VITALS — BP 110/70 | HR 83 | Ht 71.0 in | Wt 252.0 lb

## 2024-03-02 DIAGNOSIS — E669 Obesity, unspecified: Secondary | ICD-10-CM

## 2024-03-02 DIAGNOSIS — I5022 Chronic systolic (congestive) heart failure: Secondary | ICD-10-CM

## 2024-03-02 DIAGNOSIS — E1169 Type 2 diabetes mellitus with other specified complication: Secondary | ICD-10-CM | POA: Diagnosis not present

## 2024-03-02 DIAGNOSIS — Z789 Other specified health status: Secondary | ICD-10-CM | POA: Diagnosis not present

## 2024-03-02 DIAGNOSIS — E119 Type 2 diabetes mellitus without complications: Secondary | ICD-10-CM

## 2024-03-02 DIAGNOSIS — I10 Essential (primary) hypertension: Secondary | ICD-10-CM

## 2024-03-02 DIAGNOSIS — I25110 Atherosclerotic heart disease of native coronary artery with unstable angina pectoris: Secondary | ICD-10-CM

## 2024-03-02 DIAGNOSIS — E782 Mixed hyperlipidemia: Secondary | ICD-10-CM | POA: Diagnosis not present

## 2024-03-02 MED ORDER — EMPAGLIFLOZIN 10 MG PO TABS: 10.0000 mg | ORAL_TABLET | Freq: Every day | ORAL | 3 refills | Status: DC

## 2024-03-02 NOTE — Patient Instructions (Signed)
 Medication Instructions:  Your physician has recommended you make the following change in your medication:   START: Jardiance 10 mg daily  *If you need a refill on your cardiac medications before your next appointment, please call your pharmacy*  Lab Work: Your physician recommends that you return for lab work in:   Labs today: BMP Labs in 2 weeks: BMP  If you have labs (blood work) drawn today and your tests are completely normal, you will receive your results only by: MyChart Message (if you have MyChart) OR A paper copy in the mail If you have any lab test that is abnormal or we need to change your treatment, we will call you to review the results.  Testing/Procedures: None  Follow-Up: At [facility], you and your health needs are our priority.  As part of our continuing mission to provide you with exceptional heart care, our providers are all part of one team.  This team includes your primary Cardiologist (physician) and Advanced Practice Providers or APPs (Physician Assistants and Nurse Practitioners) who all work together to provide you with the care you need, when you need it.  Your next appointment:   1 month(s)  Provider:   Delon Hoover, NP [place])    We recommend signing up for the patient portal called MyChart.  Sign up information is provided on this After Visit Summary.  MyChart is used to connect with patients for Virtual Visits (Telemedicine).  Patients are able to view lab/test results, encounter notes, upcoming appointments, etc.  Non-urgent messages can be sent to your provider as well.   To learn more about what you can do with MyChart, go to [email or web address].   Other Instructions None

## 2024-03-03 ENCOUNTER — Ambulatory Visit: Payer: Self-pay | Admitting: Cardiology

## 2024-03-03 LAB — BASIC METABOLIC PANEL WITH GFR
BUN/Creatinine Ratio: 14 (ref 10–24)
BUN: 18 mg/dL (ref 8–27)
CO2: 21 mmol/L (ref 20–29)
Calcium: 9.5 mg/dL (ref 8.6–10.2)
Chloride: 98 mmol/L (ref 96–106)
Creatinine, Ser: 1.26 mg/dL (ref 0.76–1.27)
Glucose: 235 mg/dL — ABNORMAL HIGH (ref 70–99)
Potassium: 4.7 mmol/L (ref 3.5–5.2)
Sodium: 135 mmol/L (ref 134–144)
eGFR: 58 mL/min/1.73 — ABNORMAL LOW

## 2024-03-16 DIAGNOSIS — I5022 Chronic systolic (congestive) heart failure: Secondary | ICD-10-CM | POA: Diagnosis not present

## 2024-03-16 DIAGNOSIS — I25110 Atherosclerotic heart disease of native coronary artery with unstable angina pectoris: Secondary | ICD-10-CM | POA: Diagnosis not present

## 2024-03-17 LAB — BASIC METABOLIC PANEL WITH GFR
BUN/Creatinine Ratio: 7 — ABNORMAL LOW (ref 10–24)
BUN: 9 mg/dL (ref 8–27)
CO2: 19 mmol/L — ABNORMAL LOW (ref 20–29)
Calcium: 9.7 mg/dL (ref 8.6–10.2)
Chloride: 99 mmol/L (ref 96–106)
Creatinine, Ser: 1.24 mg/dL (ref 0.76–1.27)
Glucose: 266 mg/dL — ABNORMAL HIGH (ref 70–99)
Potassium: 4.3 mmol/L (ref 3.5–5.2)
Sodium: 138 mmol/L (ref 134–144)
eGFR: 60 mL/min/1.73

## 2024-03-28 NOTE — Progress Notes (Signed)
 Cardiology Office Note   Date:  03/30/2024  ID:  CARRIE SCHOONMAKER, DOB [birth year], MRN [id number] PCP: Ina Marcellus RAMAN, MD  [facility] Providers Cardiologist:  Lamar Fitch, MD     History of Present Illness Lance Shepherd is a 78 y.o. male with a past medical history of CAD with interventions listed below, HTN, COPD, OSA, DM2, hypothyroidism, dyslipidemia with statin intolerance.   12/31/2023 echo EF 50 to 55%, global hypokinesis, moderate LVH, moderate thickening of the aortic valve 01/14/2023 echo EF 45 to 50%, moderate LVH, grade 1 DD 10/01/2022 monitor average heart rate 96 bpm, 1 run of SVT 09/11/2021 echo EF 55 to 60%, grade 1 DD 09/03/2021 cardiac catheterization subtotal occlusion of large OM1 DES x 1 09/03/2021 echo EF 50 to 55%, mild calcification of the aortic valve 09/19/2020 echo EF 60-65%, grade 1 DD 09/11/2020 Lexiscan  small defect of mild severity, low risk study 2019 PTCA and DES to obtuse marginal 2017 PTCA and DES to RCA and circumflex  He established care with Dr. Fitch in 202. In 2017 he underwent PTCA DES to the RCA and circumflex, 2019 PTCA and DES to his obtuse marginal.  In 2022 he underwent a Lexiscan  revealing a small defect of mild severity however low risk study.  In 2022 he underwent left heart cath revealing subtotal occlusion of large OM1 with DES x 1.  Evaluated by Dr. Fitch on 12/08/2023 for evaluation of pedal edema, repeat echocardiogram was arranged revealing EF of 50 to 55%, global hypokinesis and moderate thickening of his aortic valve.  Most recently was evaluated by myself on 03/02/2024 at the behest of his PCP for evaluation of pedal edema and shortness of breath that have been occurring for a few months, he continued to take Lasix  but it was not helping with his symptoms, we started him on Jardiance  with plans to follow-up in 1 month.  He present today for follow-up of his heart failure.  He took Jardiance  for approximately 20 days but stated  he had several adverse side effects from the medication including a rash, and worsening shortness of breath.  He is somewhat of a poor historian, still bothered by shortness of breath.  We did talk about heart failure and different medications that we could try.  He does have pedal edema but it is somewhat better.  He does not weigh himself daily, does not seem to have a good insight to his heart failure. He denies chest pain, palpitations, dyspnea, pnd, orthopnea, n, v, dizziness, syncope, edema, weight gain, or early satiety.     ROS: Review of Systems  Respiratory:  Positive for shortness of breath.   Cardiovascular:  Positive for leg swelling.  All other systems reviewed and are negative.    Studies Reviewed     Cardiac Studies & Procedures   ______________________________________________________________________________________________ CARDIAC CATHETERIZATION  CARDIAC CATHETERIZATION 09/03/2021  Conclusion   Prox RCA lesion is 50% stenosed.   Dist RCA lesion is 70% stenosed.   1st Mrg lesion is 99% stenosed.   Prox LAD to Mid LAD lesion is 50% stenosed.   A drug-eluting stent was successfully placed using a SYNERGY XD 3.0X20.   Post intervention, there is a 0% residual stenosis.  Subtotal occlusion of a large OM 1 branch, treated successfully with PCI using a 3.0x20 mm Synergy DES, improving the lesion from 99--->0%, TIMI-2---->TIMI-3 flow Moderate segmental RCA stenosis Moderate diffuse proximal LAD stenosis  Recommend: DAPT with ASA and plavix  minimum 12 months, aggressive risk  reduction measures, medical therapy for residual CAD. If recurrent angina, could assess RCA and LAD for intervention as needed.  Findings Coronary Findings Diagnostic  Dominance: Right  Left Anterior Descending Prox LAD to Mid LAD lesion is 50% stenosed. The lesion is calcified.  Left Circumflex  First Obtuse Marginal Branch 1st Mrg lesion is 99% stenosed. TIMI-2 flow  Right Coronary  Artery Prox RCA lesion is 50% stenosed. Dist RCA lesion is 70% stenosed.  Intervention  1st Mrg lesion Stent Pre-stent angioplasty was performed using a BALLN SAPPHIRE Irene 3.25X15. A drug-eluting stent was successfully placed using a SYNERGY XD 3.0X20. Post-stent angioplasty was performed using a BALLN SAPPHIRE [place] 3.25X15. Maximum pressure:  16 atm. Post-Intervention Lesion Assessment The intervention was successful. Pre-interventional TIMI flow is 2. Post-intervention TIMI flow is 3. No complications occurred at this lesion. There is a 0% residual stenosis post intervention.   STRESS TESTS  MYOCARDIAL PERFUSION IMAGING 09/11/2020  Interpretation Summary  Nuclear stress EF: 60%.  The left ventricular ejection fraction is normal (55-65%).  There was no ST segment deviation noted during stress.  Defect 1: There is a small defect of mild severity present in the basal inferior and apical lateral location.  This is a low risk study.  No evidence of ischemia or MI. Diaphragmatic attenuation noted.   ECHOCARDIOGRAM  ECHOCARDIOGRAM COMPLETE 12/30/2023  Narrative ECHOCARDIOGRAM REPORT    Patient Name:   Lance Shepherd Date of Exam: 12/30/2023 Medical Rec #:  [id number]    Height:       71.0 in Accession #:    [id number]   Weight:       250.6 lb Date of Birth:  1945-10-21    BSA:          2.321 m Patient Age:    64 years     BP:           142/84 mmHg Patient Gender: M            HR:           92 bpm. Exam Location:  [place]  Procedure: 2D Echo, Cardiac Doppler, Color Doppler and Intracardiac Opacification Agent (Both Spectral and Color Flow Doppler were utilized during procedure).  Indications:    Dyspnea on exertion [R06.09 (ICD-10-CM)]  History:        Patient has prior history of Echocardiogram examinations, most recent 01/14/2023. CAD, COPD, Signs/Symptoms:Chest Pain and Dyspnea; Risk Factors:Diabetes and Dyslipidemia.  Sonographer:    Charlie Jointer RDCS Referring  Phys: [id number] LAMAR JINNY FITCH   Sonographer Comments: Technically difficult study due to poor echo windows. IMPRESSIONS   1. Technically difficult study, Definity  contrast used. 2. Left ventricular ejection fraction, by estimation, is 50 to 55%. The left ventricle has mildly decreased function. The left ventricle demonstrates global hypokinesis. There is moderate left ventricular hypertrophy. Left ventricular diastolic parameters are indeterminate. 3. Right ventricular systolic function is normal. The right ventricular size is not well visualized. 4. The mitral valve was not well visualized. No evidence of mitral valve regurgitation. No evidence of mitral stenosis. 5. The aortic valve was not well visualized. There is moderate thickening of the aortic valve. Aortic valve regurgitation is not visualized. No aortic stenosis is present. Aortic valve area, by VTI measures 2.54 cm. Aortic valve mean gradient measures 3.9 mmHg. Aortic valve Vmax measures 1.36 m/s. 6. The inferior vena cava is normal in size with greater than 50% respiratory variability, suggesting right atrial pressure of 3 mmHg.  FINDINGS Left Ventricle:  Left ventricular ejection fraction, by estimation, is 50 to 55%. The left ventricle has mildly decreased function. The left ventricle demonstrates global hypokinesis. Definity  contrast agent was given IV to delineate the left ventricular endocardial borders. The left ventricular internal cavity size was normal in size. There is moderate left ventricular hypertrophy. Left ventricular diastolic parameters are indeterminate.  Right Ventricle: The right ventricular size is not well visualized. Right vetricular wall thickness was not well visualized. Right ventricular systolic function is normal.  Left Atrium: Left atrial size was normal in size.  Right Atrium: Right atrial size was not well visualized.  Pericardium: There is no evidence of pericardial effusion.  Mitral Valve:  The mitral valve was not well visualized. No evidence of mitral valve regurgitation. No evidence of mitral valve stenosis.  Tricuspid Valve: The tricuspid valve is not well visualized. Tricuspid valve regurgitation is not demonstrated. No evidence of tricuspid stenosis.  Aortic Valve: The aortic valve was not well visualized. There is moderate thickening of the aortic valve. Aortic valve regurgitation is not visualized. No aortic stenosis is present. Aortic valve mean gradient measures 3.9 mmHg. Aortic valve peak gradient measures 7.4 mmHg. Aortic valve area, by VTI measures 2.54 cm.  Pulmonic Valve: The pulmonic valve was not well visualized. Pulmonic valve regurgitation is not visualized. No evidence of pulmonic stenosis.  Aorta: The aortic root and ascending aorta are structurally normal, with no evidence of dilitation.  Venous: The inferior vena cava is normal in size with greater than 50% respiratory variability, suggesting right atrial pressure of 3 mmHg.  IAS/Shunts: The interatrial septum was not well visualized.   LEFT VENTRICLE PLAX 2D LVIDd:         5.10 cm   Diastology LVIDs:         3.90 cm   LV e' medial:    4.03 cm/s LV PW:         1.40 cm   LV E/e' medial:  19.6 LV IVS:        1.50 cm   LV e' lateral:   6.42 cm/s LVOT diam:     2.30 cm   LV E/e' lateral: 12.3 LV SV:         62 LV SV Index:   27 LVOT Area:     4.15 cm   IVC IVC diam: 1.50 cm  LEFT ATRIUM             Index        RIGHT ATRIUM           Index LA diam:        3.10 cm 1.34 cm/m   RA Area:     14.00 cm LA Vol (A2C):   47.1 ml 20.30 ml/m  RA Volume:   29.90 ml  12.88 ml/m LA Vol (A4C):   40.1 ml 17.28 ml/m LA Biplane Vol: 45.7 ml 19.69 ml/m AORTIC VALVE AV Area (Vmax):    2.47 cm AV Area (Vmean):   2.44 cm AV Area (VTI):     2.54 cm AV Vmax:           136.36 cm/s AV Vmean:          90.834 cm/s AV VTI:            0.243 m AV Peak Grad:      7.4 mmHg AV Mean Grad:      3.9 mmHg LVOT Vmax:          81.10 cm/s LVOT Vmean:  53.367 cm/s LVOT VTI:          0.148 m LVOT/AV VTI ratio: 0.61  AORTA Ao Root diam: 3.60 cm Ao Asc diam:  3.50 cm Ao Desc diam: 2.80 cm  MITRAL VALVE MV Area (PHT): 4.96 cm     SHUNTS MV Decel Time: 153 msec     Systemic VTI:  0.15 m MV E velocity: 78.90 cm/s   Systemic Diam: 2.30 cm MV A velocity: 114.00 cm/s MV E/A ratio:  0.69  Sreedhar reddy Madireddy Electronically signed by Alean reddy Madireddy Signature Date/Time: 12/31/2023/1:34:19 PM    Final    MONITORS  LONG TERM MONITOR (3-14 DAYS) 10/16/2022  Narrative Patch Wear Time:  7 days and 0 hours (2024-01-24T08:46:58-0500 to 2024-01-31T09:03:44-0500)  Patient had a min HR of 71 bpm, max HR of 162 bpm, and avg HR of 96 bpm. Predominant underlying rhythm was Sinus Rhythm. 1 run of Supraventricular Tachycardia occurred lasting 11 beats with a max rate of 162 bpm (avg 144 bpm). Isolated SVEs were rare (<1.0%), SVE Couplets were rare (<1.0%), and SVE Triplets were rare (<1.0%). Isolated VEs were occasional (1.6%, 15290), VE Couplets were rare (<1.0%, 795), and VE Triplets were rare (<1.0%, 20).  Summary conclusions. 1 episode of supraventricular tachycardia.  Occasional ventricular ectopy total burden of 1.6%       ______________________________________________________________________________________________      Risk Assessment/Calculations           Physical Exam VS:  BP 120/80   Pulse 80   Ht 5' 11 (1.803 m)   Wt 250 lb (113.4 kg)   SpO2 94%   BMI 34.87 kg/m        Wt Readings from Last 3 Encounters:  03/30/24 250 lb (113.4 kg)  03/02/24 252 lb (114.3 kg)  12/08/23 250 lb 9.6 oz (113.7 kg)    GEN: Well nourished, well developed in no acute distress NECK: No JVD; No carotid bruits CARDIAC: RRR, no murmurs, rubs, gallops RESPIRATORY: Trace bilateral Rales ABDOMEN: Distended EXTREMITIES:  + 2 pitting edema mid shin; No deformity   ASSESSMENT AND  PLAN CAD-continue aspirin  81 mg daily, Plavix  75 mg daily, Repatha , metoprolol  50 mg twice daily, nitroglycerin  as needed. Stable with no anginal symptoms. No indication for ischemic evaluation.    HFmrEF-continue Entresto  24-26 mg twice daily, metoprolol  50 mg twice daily. NYHA class II-III, has been taking lasix  every other day, will increase to 40 mg PO x 2 for the next two days then 20 mg daily. 03/16/24 potassium 4.3, Cr 1.24. Return in 1 week for nurse visit, repeat BMET and to see if he needs Furoscix . Consider MRA at future visit.     Hypertension-his blood pressure is well-controlled at 120/80, currently only on metoprolol .  Dyslipidemia-he states he thinks his PCP just checked this, continue Repatha . Will request recent lipid panel.   DM2 - currently on insulin , most recent A1C on file for review elevated at 12%, obviously uncontrolled.   Dispo: For the next two days take lasix  40 mg >> 20 mg daily. Return in 1 week for a nurse visit for HF symptom check (weight, lung sounds), he may need Furoscix  in office.   Signed, Delon JAYSON Hoover, NP

## 2024-03-30 ENCOUNTER — Ambulatory Visit: Attending: Cardiology | Admitting: Cardiology

## 2024-03-30 ENCOUNTER — Encounter: Payer: Self-pay | Admitting: Cardiology

## 2024-03-30 VITALS — BP 120/80 | HR 80 | Ht 71.0 in | Wt 250.0 lb

## 2024-03-30 DIAGNOSIS — E119 Type 2 diabetes mellitus without complications: Secondary | ICD-10-CM

## 2024-03-30 DIAGNOSIS — E782 Mixed hyperlipidemia: Secondary | ICD-10-CM

## 2024-03-30 DIAGNOSIS — Z789 Other specified health status: Secondary | ICD-10-CM | POA: Diagnosis not present

## 2024-03-30 DIAGNOSIS — I10 Essential (primary) hypertension: Secondary | ICD-10-CM | POA: Diagnosis not present

## 2024-03-30 DIAGNOSIS — I5022 Chronic systolic (congestive) heart failure: Secondary | ICD-10-CM

## 2024-03-30 DIAGNOSIS — I25110 Atherosclerotic heart disease of native coronary artery with unstable angina pectoris: Secondary | ICD-10-CM | POA: Diagnosis not present

## 2024-03-30 DIAGNOSIS — E1169 Type 2 diabetes mellitus with other specified complication: Secondary | ICD-10-CM | POA: Diagnosis not present

## 2024-03-30 DIAGNOSIS — E669 Obesity, unspecified: Secondary | ICD-10-CM

## 2024-03-30 MED ORDER — FUROSEMIDE 20 MG PO TABS: ORAL_TABLET | ORAL | 3 refills | Status: AC

## 2024-03-30 NOTE — Patient Instructions (Signed)
 Medication Instructions:  Your physician has recommended you make the following change in your medication:   START: Lasix  20 mg (Take Lasix  40 mg = 2 pills for 2 days, then lasix  20 mg = 1 pill there after.  *If you need a refill on your cardiac medications before your next appointment, please call your pharmacy*  Lab Work: None If you have labs (blood work) drawn today and your tests are completely normal, you will receive your results only by: MyChart Message (if you have MyChart) OR A paper copy in the mail If you have any lab test that is abnormal or we need to change your treatment, we will call you to review the results.  Testing/Procedures: None  Follow-Up: At [facility], you and your health needs are our priority.  As part of our continuing mission to provide you with exceptional heart care, our providers are all part of one team.  This team includes your primary Cardiologist (physician) and Advanced Practice Providers or APPs (Physician Assistants and Nurse Practitioners) who all work together to provide you with the care you need, when you need it.  Your next appointment:   3 month(s)  Provider:   Lamar Fitch, MD    We recommend signing up for the patient portal called MyChart.  Sign up information is provided on this After Visit Summary.  MyChart is used to connect with patients for Virtual Visits (Telemedicine).  Patients are able to view lab/test results, encounter notes, upcoming appointments, etc.  Non-urgent messages can be sent to your provider as well.   To learn more about what you can do with MyChart, go to [email or web address].   Other Instructions Nurse visit in 1 week for weight check and BMP lab draw.

## 2024-04-06 DIAGNOSIS — H539 Unspecified visual disturbance: Secondary | ICD-10-CM | POA: Diagnosis not present

## 2024-04-06 DIAGNOSIS — G459 Transient cerebral ischemic attack, unspecified: Secondary | ICD-10-CM | POA: Diagnosis not present

## 2024-04-06 DIAGNOSIS — R000 Tachycardia, unspecified: Secondary | ICD-10-CM | POA: Diagnosis not present

## 2024-04-06 DIAGNOSIS — I5040 Unspecified combined systolic (congestive) and diastolic (congestive) heart failure: Secondary | ICD-10-CM | POA: Diagnosis not present

## 2024-04-07 DIAGNOSIS — Z8673 Personal history of transient ischemic attack (TIA), and cerebral infarction without residual deficits: Secondary | ICD-10-CM | POA: Diagnosis not present

## 2024-04-07 DIAGNOSIS — H539 Unspecified visual disturbance: Secondary | ICD-10-CM | POA: Diagnosis not present

## 2024-04-07 DIAGNOSIS — R29701 NIHSS score 1: Secondary | ICD-10-CM | POA: Diagnosis not present

## 2024-04-07 DIAGNOSIS — H538 Other visual disturbances: Secondary | ICD-10-CM | POA: Diagnosis not present

## 2024-04-07 DIAGNOSIS — E785 Hyperlipidemia, unspecified: Secondary | ICD-10-CM | POA: Diagnosis not present

## 2024-04-07 DIAGNOSIS — Z955 Presence of coronary angioplasty implant and graft: Secondary | ICD-10-CM | POA: Diagnosis not present

## 2024-04-07 DIAGNOSIS — R29700 NIHSS score 0: Secondary | ICD-10-CM | POA: Diagnosis not present

## 2024-04-07 DIAGNOSIS — Z7902 Long term (current) use of antithrombotics/antiplatelets: Secondary | ICD-10-CM | POA: Diagnosis not present

## 2024-04-07 DIAGNOSIS — I4510 Unspecified right bundle-branch block: Secondary | ICD-10-CM | POA: Diagnosis not present

## 2024-04-07 DIAGNOSIS — E1165 Type 2 diabetes mellitus with hyperglycemia: Secondary | ICD-10-CM | POA: Diagnosis not present

## 2024-04-07 DIAGNOSIS — I639 Cerebral infarction, unspecified: Secondary | ICD-10-CM | POA: Diagnosis not present

## 2024-04-07 DIAGNOSIS — G459 Transient cerebral ischemic attack, unspecified: Secondary | ICD-10-CM | POA: Diagnosis not present

## 2024-04-07 DIAGNOSIS — I6523 Occlusion and stenosis of bilateral carotid arteries: Secondary | ICD-10-CM | POA: Diagnosis not present

## 2024-04-07 DIAGNOSIS — I5042 Chronic combined systolic (congestive) and diastolic (congestive) heart failure: Secondary | ICD-10-CM | POA: Diagnosis not present

## 2024-04-07 DIAGNOSIS — I110 Hypertensive heart disease with heart failure: Secondary | ICD-10-CM | POA: Diagnosis not present

## 2024-04-07 DIAGNOSIS — Z91148 Patient's other noncompliance with medication regimen for other reason: Secondary | ICD-10-CM | POA: Diagnosis not present

## 2024-04-07 DIAGNOSIS — I517 Cardiomegaly: Secondary | ICD-10-CM | POA: Diagnosis not present

## 2024-04-07 DIAGNOSIS — I5040 Unspecified combined systolic (congestive) and diastolic (congestive) heart failure: Secondary | ICD-10-CM | POA: Diagnosis not present

## 2024-04-07 DIAGNOSIS — I2510 Atherosclerotic heart disease of native coronary artery without angina pectoris: Secondary | ICD-10-CM | POA: Diagnosis not present

## 2024-04-07 DIAGNOSIS — R000 Tachycardia, unspecified: Secondary | ICD-10-CM | POA: Diagnosis not present

## 2024-04-07 DIAGNOSIS — I6389 Other cerebral infarction: Secondary | ICD-10-CM | POA: Diagnosis not present

## 2024-04-07 DIAGNOSIS — Z7982 Long term (current) use of aspirin: Secondary | ICD-10-CM | POA: Diagnosis not present

## 2024-04-07 DIAGNOSIS — Z794 Long term (current) use of insulin: Secondary | ICD-10-CM | POA: Diagnosis not present

## 2024-04-07 HISTORY — DX: Cerebral infarction, unspecified: I63.9

## 2024-04-08 DIAGNOSIS — I517 Cardiomegaly: Secondary | ICD-10-CM | POA: Diagnosis not present

## 2024-04-10 DIAGNOSIS — E119 Type 2 diabetes mellitus without complications: Secondary | ICD-10-CM | POA: Diagnosis not present

## 2024-04-10 DIAGNOSIS — S0990XA Unspecified injury of head, initial encounter: Secondary | ICD-10-CM | POA: Diagnosis not present

## 2024-04-10 DIAGNOSIS — Z8673 Personal history of transient ischemic attack (TIA), and cerebral infarction without residual deficits: Secondary | ICD-10-CM | POA: Diagnosis not present

## 2024-04-10 DIAGNOSIS — J449 Chronic obstructive pulmonary disease, unspecified: Secondary | ICD-10-CM | POA: Diagnosis not present

## 2024-04-10 DIAGNOSIS — I4510 Unspecified right bundle-branch block: Secondary | ICD-10-CM | POA: Diagnosis not present

## 2024-04-10 DIAGNOSIS — Z794 Long term (current) use of insulin: Secondary | ICD-10-CM | POA: Diagnosis not present

## 2024-04-10 DIAGNOSIS — E7800 Pure hypercholesterolemia, unspecified: Secondary | ICD-10-CM | POA: Diagnosis not present

## 2024-04-10 DIAGNOSIS — I10 Essential (primary) hypertension: Secondary | ICD-10-CM | POA: Diagnosis not present

## 2024-04-10 DIAGNOSIS — R9431 Abnormal electrocardiogram [ECG] [EKG]: Secondary | ICD-10-CM | POA: Diagnosis not present

## 2024-04-10 DIAGNOSIS — Z7902 Long term (current) use of antithrombotics/antiplatelets: Secondary | ICD-10-CM | POA: Diagnosis not present

## 2024-04-10 DIAGNOSIS — R29818 Other symptoms and signs involving the nervous system: Secondary | ICD-10-CM | POA: Diagnosis not present

## 2024-04-10 DIAGNOSIS — Z7982 Long term (current) use of aspirin: Secondary | ICD-10-CM | POA: Diagnosis not present

## 2024-04-10 DIAGNOSIS — I498 Other specified cardiac arrhythmias: Secondary | ICD-10-CM | POA: Diagnosis not present

## 2024-04-10 DIAGNOSIS — I252 Old myocardial infarction: Secondary | ICD-10-CM | POA: Diagnosis not present

## 2024-04-10 DIAGNOSIS — H579 Unspecified disorder of eye and adnexa: Secondary | ICD-10-CM | POA: Diagnosis not present

## 2024-04-10 DIAGNOSIS — Z79899 Other long term (current) drug therapy: Secondary | ICD-10-CM | POA: Diagnosis not present

## 2024-04-10 DIAGNOSIS — H539 Unspecified visual disturbance: Secondary | ICD-10-CM | POA: Diagnosis not present

## 2024-04-12 DIAGNOSIS — Z8673 Personal history of transient ischemic attack (TIA), and cerebral infarction without residual deficits: Secondary | ICD-10-CM | POA: Diagnosis not present

## 2024-04-13 ENCOUNTER — Ambulatory Visit

## 2024-04-27 ENCOUNTER — Encounter: Payer: Self-pay | Admitting: Internal Medicine

## 2024-04-28 ENCOUNTER — Encounter: Payer: Self-pay | Admitting: Internal Medicine

## 2024-05-02 LAB — HM DIABETES EYE EXAM

## 2024-05-10 NOTE — Progress Notes (Signed)
 " Cardiology Office Note   Date:  05/13/2024  ID:  Lance Shepherd, DOB 25-Oct-1945, MRN [id number] PCP: Ina Marcellus RAMAN, MD  [facility] Providers Cardiologist:  Lance Fitch, MD Cardiology APP:  Carlin Delon BROCKS, NP     History of Present Illness Lance Shepherd is a 78 y.o. male with a past medical history of CAD with interventions listed below, HTN, COPD, OSA, DM2, hypothyroidism, dyslipidemia with statin intolerance, history of stroke, noncompliance with medications.   04/08/2024 echo EF 55 to 60%, impaired relaxation, trivial MR 12/31/2023 echo EF 50 to 55%, global hypokinesis, moderate LVH, moderate thickening of the aortic valve 01/14/2023 echo EF 45 to 50%, moderate LVH, grade 1 DD 10/01/2022 monitor average heart rate 96 bpm, 1 run of SVT 09/11/2021 echo EF 55 to 60%, grade 1 DD 09/03/2021 cardiac catheterization subtotal occlusion of large OM1 DES x 1 09/03/2021 echo EF 50 to 55%, mild calcification of the aortic valve 09/19/2020 echo EF 60-65%, grade 1 DD 09/11/2020 Lexiscan  small defect of mild severity, low risk study 2019 PTCA and DES to obtuse marginal 2017 PTCA and DES to RCA and circumflex  He established care with Dr. Fitch in 202. In 2017 he underwent PTCA DES to the RCA and circumflex, 2019 PTCA and DES to his obtuse marginal.  In 2022 he underwent a Lexiscan  revealing a small defect of mild severity however low risk study.  In 2022 he underwent left heart cath revealing subtotal occlusion of large OM1 with DES x 1.  Evaluated by Dr. Fitch on 12/08/2023 for evaluation of pedal edema, repeat echocardiogram was arranged revealing EF of 50 to 55%, global hypokinesis and moderate thickening of his aortic valve.  Evaluated by myself on 03/02/2024 at the behest of his PCP for evaluation of pedal edema and shortness of breath that have been occurring for a few months, he continued to take Lasix  but it was not helping with his symptoms, we started him on Jardiance  with plans to  follow-up in 1 month.  Evaluated myself on 03/30/2024, he had been unable to tolerate Jardiance , did not appear to have a good insight to his heart failure, we adjusted his diuretic and he plans for 1 week follow-up to see if he might need Furoscix  however he did not follow back up for this visit.,   Most recently admitted to [facility] 04/07/2024 with visual disturbances and brief confusion, CT of his head was negative however MRI revealed acute left occipital infarction, see if his head and neck revealed no evidence of large vessel occlusion, moderate stenosis of bilateral carotid arteries, echocardiogram with normal EF, no evidence of ASD.  His LDL was elevated at 121, it was determined he had been noncompliant with many of his medications, he was started on aspirin  and Plavix  as well as pravastatin .  He presented back to the emergency department on 04/10/2024 with visual disturbance again, recommendation for repeat MRI and EEG-he ultimately refused both and left AMA.  He presents today for follow-up after recent hospitalization as outlined above.  He questions whether he really had a stroke or not, we reviewed that according to the MRI during his first hospitalization he did have a left occipital infarction.  He is overall relatively asymptomatic regarding this.  He is hard of hearing, he does have poor insight to his comorbid conditions, but he does state he has now taken his medications as directed.  His pedal edema is slightly improved as well.  He does not have  any formal complaints from a cardiac perspective today. He denies chest pain, palpitations, dyspnea, pnd, orthopnea, n, v, dizziness, syncope, weight gain, or early satiety.    ROS: Review of Systems  Respiratory:  Positive for shortness of breath.   Cardiovascular:  Positive for leg swelling.  All other systems reviewed and are negative.    Studies Reviewed     Cardiac Studies & Procedures    ______________________________________________________________________________________________ CARDIAC CATHETERIZATION  CARDIAC CATHETERIZATION 09/03/2021  Conclusion   Prox RCA lesion is 50% stenosed.   Dist RCA lesion is 70% stenosed.   1st Mrg lesion is 99% stenosed.   Prox LAD to Mid LAD lesion is 50% stenosed.   A drug-eluting stent was successfully placed using a SYNERGY XD 3.0X20.   Post intervention, there is a 0% residual stenosis.  Subtotal occlusion of a large OM 1 branch, treated successfully with PCI using a 3.0x20 mm Synergy DES, improving the lesion from 99--->0%, TIMI-2---->TIMI-3 flow Moderate segmental RCA stenosis Moderate diffuse proximal LAD stenosis  Recommend: DAPT with ASA and plavix  minimum 12 months, aggressive risk reduction measures, medical therapy for residual CAD. If recurrent angina, could assess RCA and LAD for intervention as needed.  Findings Coronary Findings Diagnostic  Dominance: Right  Left Anterior Descending Prox LAD to Mid LAD lesion is 50% stenosed. The lesion is calcified.  Left Circumflex  First Obtuse Marginal Branch 1st Mrg lesion is 99% stenosed. TIMI-2 flow  Right Coronary Artery Prox RCA lesion is 50% stenosed. Dist RCA lesion is 70% stenosed.  Intervention  1st Mrg lesion Stent Pre-stent angioplasty was performed using a BALLN SAPPHIRE [place] 3.25X15. A drug-eluting stent was successfully placed using a SYNERGY XD 3.0X20. Post-stent angioplasty was performed using a BALLN SAPPHIRE [place] 3.25X15. Maximum pressure:  16 atm. Post-Intervention Lesion Assessment The intervention was successful. Pre-interventional TIMI flow is 2. Post-intervention TIMI flow is 3. No complications occurred at this lesion. There is a 0% residual stenosis post intervention.   STRESS TESTS  MYOCARDIAL PERFUSION IMAGING 09/11/2020  Interpretation Summary  Nuclear stress EF: 60%.  The left ventricular ejection fraction is normal (55-65%).  There  was no ST segment deviation noted during stress.  Defect 1: There is a small defect of mild severity present in the basal inferior and apical lateral location.  This is a low risk study.  No evidence of ischemia or MI. Diaphragmatic attenuation noted.   ECHOCARDIOGRAM  ECHOCARDIOGRAM COMPLETE 12/30/2023  Narrative ECHOCARDIOGRAM REPORT    Patient Name:   DEMARI GALES Date of Exam: 12/30/2023 Medical Rec #:  [id number]    Height:       71.0 in Accession #:    [id number]   Weight:       250.6 lb Date of Birth:  07/14/1946    BSA:          2.321 m Patient Age:    85 years     BP:           142/84 mmHg Patient Gender: M            HR:           92 bpm. Exam Location:  [place]  Procedure: 2D Echo, Cardiac Doppler, Color Doppler and Intracardiac Opacification Agent (Both Spectral and Color Flow Doppler were utilized during procedure).  Indications:    Dyspnea on exertion [R06.09 (ICD-10-CM)]  History:        Patient has prior history of Echocardiogram examinations, most recent 01/14/2023. CAD, COPD, Signs/Symptoms:Chest Pain and  Dyspnea; Risk Factors:Diabetes and Dyslipidemia.  Sonographer:    Lance Shepherd RDCS Referring Phys: [id number] Lance Shepherd   Sonographer Comments: Technically difficult study due to poor echo windows. IMPRESSIONS   1. Technically difficult study, Definity  contrast used. 2. Left ventricular ejection fraction, by estimation, is 50 to 55%. The left ventricle has mildly decreased function. The left ventricle demonstrates global hypokinesis. There is moderate left ventricular hypertrophy. Left ventricular diastolic parameters are indeterminate. 3. Right ventricular systolic function is normal. The right ventricular size is not well visualized. 4. The mitral valve was not well visualized. No evidence of mitral valve regurgitation. No evidence of mitral stenosis. 5. The aortic valve was not well visualized. There is moderate thickening of the aortic  valve. Aortic valve regurgitation is not visualized. No aortic stenosis is present. Aortic valve area, by VTI measures 2.54 cm. Aortic valve mean gradient measures 3.9 mmHg. Aortic valve Vmax measures 1.36 m/s. 6. The inferior vena cava is normal in size with greater than 50% respiratory variability, suggesting right atrial pressure of 3 mmHg.  FINDINGS Left Ventricle: Left ventricular ejection fraction, by estimation, is 50 to 55%. The left ventricle has mildly decreased function. The left ventricle demonstrates global hypokinesis. Definity  contrast agent was given IV to delineate the left ventricular endocardial borders. The left ventricular internal cavity size was normal in size. There is moderate left ventricular hypertrophy. Left ventricular diastolic parameters are indeterminate.  Right Ventricle: The right ventricular size is not well visualized. Right vetricular wall thickness was not well visualized. Right ventricular systolic function is normal.  Left Atrium: Left atrial size was normal in size.  Right Atrium: Right atrial size was not well visualized.  Pericardium: There is no evidence of pericardial effusion.  Mitral Valve: The mitral valve was not well visualized. No evidence of mitral valve regurgitation. No evidence of mitral valve stenosis.  Tricuspid Valve: The tricuspid valve is not well visualized. Tricuspid valve regurgitation is not demonstrated. No evidence of tricuspid stenosis.  Aortic Valve: The aortic valve was not well visualized. There is moderate thickening of the aortic valve. Aortic valve regurgitation is not visualized. No aortic stenosis is present. Aortic valve mean gradient measures 3.9 mmHg. Aortic valve peak gradient measures 7.4 mmHg. Aortic valve area, by VTI measures 2.54 cm.  Pulmonic Valve: The pulmonic valve was not well visualized. Pulmonic valve regurgitation is not visualized. No evidence of pulmonic stenosis.  Aorta: The aortic root and  ascending aorta are structurally normal, with no evidence of dilitation.  Venous: The inferior vena cava is normal in size with greater than 50% respiratory variability, suggesting right atrial pressure of 3 mmHg.  IAS/Shunts: The interatrial septum was not well visualized.   LEFT VENTRICLE PLAX 2D LVIDd:         5.10 cm   Diastology LVIDs:         3.90 cm   LV e' medial:    4.03 cm/s LV PW:         1.40 cm   LV E/e' medial:  19.6 LV IVS:        1.50 cm   LV e' lateral:   6.42 cm/s LVOT diam:     2.30 cm   LV E/e' lateral: 12.3 LV SV:         62 LV SV Index:   27 LVOT Area:     4.15 cm   IVC IVC diam: 1.50 cm  LEFT ATRIUM  Index        RIGHT ATRIUM           Index LA diam:        3.10 cm 1.34 cm/m   RA Area:     14.00 cm LA Vol (A2C):   47.1 ml 20.30 ml/m  RA Volume:   29.90 ml  12.88 ml/m LA Vol (A4C):   40.1 ml 17.28 ml/m LA Biplane Vol: 45.7 ml 19.69 ml/m AORTIC VALVE AV Area (Vmax):    2.47 cm AV Area (Vmean):   2.44 cm AV Area (VTI):     2.54 cm AV Vmax:           136.36 cm/s AV Vmean:          90.834 cm/s AV VTI:            0.243 m AV Peak Grad:      7.4 mmHg AV Mean Grad:      3.9 mmHg LVOT Vmax:         81.10 cm/s LVOT Vmean:        53.367 cm/s LVOT VTI:          0.148 m LVOT/AV VTI ratio: 0.61  AORTA Ao Root diam: 3.60 cm Ao Asc diam:  3.50 cm Ao Desc diam: 2.80 cm  MITRAL VALVE MV Area (PHT): 4.96 cm     SHUNTS MV Decel Time: 153 msec     Systemic VTI:  0.15 m MV E velocity: 78.90 cm/s   Systemic Diam: 2.30 cm MV A velocity: 114.00 cm/s MV E/A ratio:  0.69  Lance Shepherd Electronically signed by Lance Shepherd Signature Date/Time: 12/31/2023/1:34:19 PM    Final    MONITORS  LONG TERM MONITOR (3-14 DAYS) 10/16/2022  Narrative Patch Wear Time:  7 days and 0 hours (2024-01-24T08:46:58-0500 to 2024-01-31T09:03:44-0500)  Patient had a min HR of 71 bpm, max HR of 162 bpm, and avg HR of 96 bpm. Predominant  underlying rhythm was Sinus Rhythm. 1 run of Supraventricular Tachycardia occurred lasting 11 beats with a max rate of 162 bpm (avg 144 bpm). Isolated SVEs were rare (<1.0%), SVE Couplets were rare (<1.0%), and SVE Triplets were rare (<1.0%). Isolated VEs were occasional (1.6%, 15290), VE Couplets were rare (<1.0%, 795), and VE Triplets were rare (<1.0%, 20).  Summary conclusions. 1 episode of supraventricular tachycardia.  Occasional ventricular ectopy total burden of 1.6%       ______________________________________________________________________________________________      Risk Assessment/Calculations           Physical Exam VS:  BP 130/80   Pulse 86   Ht 5' 11 (1.803 m)   Wt 243 lb (110.2 kg)   SpO2 98%   BMI 33.89 kg/m        Wt Readings from Last 3 Encounters:  05/13/24 243 lb (110.2 kg)  03/30/24 250 lb (113.4 kg)  03/02/24 252 lb (114.3 kg)    GEN: Well nourished, well developed in no acute distress NECK: No JVD; No carotid bruits CARDIAC: RRR, no murmurs, rubs, gallops RESPIRATORY: Trace bilateral Rales ABDOMEN: Distended EXTREMITIES:  + 2 pitting edema mid shin; No deformity   ASSESSMENT AND PLAN CAD-continue aspirin  81 mg daily, Plavix  75 mg daily, Repatha , metoprolol  50 mg twice daily, nitroglycerin  as needed. Stable with no anginal symptoms. No indication for ischemic evaluation.    HFmrEF-continue Entresto  24-26 mg twice daily, metoprolol  50 mg twice daily. NYHA class II, pedal edema but appears to be euvolemic.  Again  encouraged him to weigh himself daily and pay close attention to any weight gain.  Continue Lasix  20 mg daily.    Hypertension-his blood pressure is controlled at 130/80, currently on Entresto  24-26 mg twice daily, metoprolol  50 mg twice daily  Dyslipidemia-he states he thinks his PCP just checked this, continue Repatha . Will request recent lipid panel.   DM2 - currently on insulin , most recent A1C on file for review elevated at 12%,  obviously uncontrolled.   Stroke-on aspirin  and Plavix -as she apparently was not taking them on a routine basis and is now trying to adhere to this.  Dispo: Start weighing daily, you may take an additional Lasix  for sudden weight gain or worsening pedal edema, follow-up in 8 weeks.  Signed, Delon JAYSON Hoover, NP  "

## 2024-05-13 ENCOUNTER — Ambulatory Visit: Attending: Cardiology | Admitting: Cardiology

## 2024-05-13 ENCOUNTER — Encounter: Payer: Self-pay | Admitting: Cardiology

## 2024-05-13 VITALS — BP 130/80 | HR 86 | Ht 71.0 in | Wt 243.0 lb

## 2024-05-13 DIAGNOSIS — I639 Cerebral infarction, unspecified: Secondary | ICD-10-CM | POA: Diagnosis not present

## 2024-05-13 DIAGNOSIS — E782 Mixed hyperlipidemia: Secondary | ICD-10-CM

## 2024-05-13 DIAGNOSIS — I5022 Chronic systolic (congestive) heart failure: Secondary | ICD-10-CM

## 2024-05-13 DIAGNOSIS — E669 Obesity, unspecified: Secondary | ICD-10-CM

## 2024-05-13 DIAGNOSIS — I25110 Atherosclerotic heart disease of native coronary artery with unstable angina pectoris: Secondary | ICD-10-CM | POA: Diagnosis not present

## 2024-05-13 DIAGNOSIS — I10 Essential (primary) hypertension: Secondary | ICD-10-CM | POA: Diagnosis not present

## 2024-05-13 DIAGNOSIS — E119 Type 2 diabetes mellitus without complications: Secondary | ICD-10-CM

## 2024-05-13 DIAGNOSIS — E1169 Type 2 diabetes mellitus with other specified complication: Secondary | ICD-10-CM

## 2024-05-13 MED ORDER — SACUBITRIL-VALSARTAN 24-26 MG PO TABS: 1.0000 | ORAL_TABLET | Freq: Two times a day (BID) | ORAL | 3 refills | Status: DC

## 2024-05-13 NOTE — Patient Instructions (Signed)
 Medication Instructions:  Your physician recommends that you continue on your current medications as directed. Please refer to the Current Medication list given to you today.  *If you need a refill on your cardiac medications before your next appointment, please call your pharmacy*  Lab Work: None If you have labs (blood work) drawn today and your tests are completely normal, you will receive your results only by: MyChart Message (if you have MyChart) OR A paper copy in the mail If you have any lab test that is abnormal or we need to change your treatment, we will call you to review the results.  Testing/Procedures: None  Follow-Up: At [facility], you and your health needs are our priority.  As part of our continuing mission to provide you with exceptional heart care, our providers are all part of one team.  This team includes your primary Cardiologist (physician) and Advanced Practice Providers or APPs (Physician Assistants and Nurse Practitioners) who all work together to provide you with the care you need, when you need it.  Your next appointment:   8 week(s)  Provider:   Lamar Fitch, MD    We recommend signing up for the patient portal called MyChart.  Sign up information is provided on this After Visit Summary.  MyChart is used to connect with patients for Virtual Visits (Telemedicine).  Patients are able to view lab/test results, encounter notes, upcoming appointments, etc.  Non-urgent messages can be sent to your provider as well.   To learn more about what you can do with MyChart, go to [email or web address].   Other Instructions None

## 2024-06-10 ENCOUNTER — Other Ambulatory Visit: Payer: Self-pay

## 2024-06-10 ENCOUNTER — Telehealth: Payer: Self-pay | Admitting: Cardiology

## 2024-06-10 MED ORDER — CLOPIDOGREL BISULFATE 75 MG PO TABS: 75.0000 mg | ORAL_TABLET | Freq: Every day | ORAL | 3 refills | Status: DC

## 2024-06-10 MED ORDER — CLOPIDOGREL BISULFATE 75 MG PO TABS: 75.0000 mg | ORAL_TABLET | Freq: Every day | ORAL | 3 refills | Status: AC

## 2024-06-10 MED ORDER — PRAVASTATIN SODIUM 40 MG PO TABS: 40.0000 mg | ORAL_TABLET | Freq: Every day | ORAL | 3 refills | Status: AC

## 2024-06-10 MED ORDER — VITAMIN D3 50 MCG (2000 UT) PO CAPS: 2000.0000 [IU] | ORAL_CAPSULE | Freq: Every day | ORAL | 3 refills | Status: AC

## 2024-06-10 MED ORDER — PRAVASTATIN SODIUM 40 MG PO TABS: 40.0000 mg | ORAL_TABLET | Freq: Every day | ORAL | 3 refills | Status: DC

## 2024-06-10 MED ORDER — SACUBITRIL-VALSARTAN 24-26 MG PO TABS: 1.0000 | ORAL_TABLET | Freq: Two times a day (BID) | ORAL | 3 refills | Status: AC

## 2024-06-10 NOTE — Telephone Encounter (Signed)
 sacubitril -valsartan  (ENTRESTO ) 24-26 MG    clopidogrel  (PLAVIX ) 75 MG tablet  pravastatin  (PRAVACHOL ) 40 MG tablet   Cholecalciferol (VITAMIN D3) 50 MCG (2000 UT) capsule  90 day supply with 3 ref per Delon Hoover, NP- sent to [facility]

## 2024-06-10 NOTE — Telephone Encounter (Signed)
*  STAT* If patient is at the pharmacy, call can be transferred to refill team.   1. Which medications need to be refilled? (please list name of each medication and dose if known)   sacubitril -valsartan  (ENTRESTO ) 24-26 MG   clopidogrel  (PLAVIX ) 75 MG tablet  pravastatin  (PRAVACHOL ) 40 MG tablet   Cholecalciferol (VITAMIN D3) 50 MCG (2000 UT) capsule    2. Would you like to learn more about the convenience, safety, & potential cost savings by using the [facility]? No    3. Are you open to using the [facility] (Type [facility]. No    4. Which pharmacy/location (including street and city if local pharmacy) is medication to be sent to? [facility] - [place], [place] - [address]    5. Do they need a 30 day or 90 day supply? 90 day    Pt states he received PLAVIX  from the hospital, but was advised by our office to continue. He is out and will need a new Rx. Please advise.

## 2024-06-10 NOTE — Telephone Encounter (Signed)
 Pt of Dr. Krasowski. Does Dr. Bernie want to refill this RX? Please advise.

## 2024-06-30 ENCOUNTER — Ambulatory Visit: Admitting: Cardiology

## 2024-07-13 ENCOUNTER — Encounter: Payer: Self-pay | Admitting: Cardiology

## 2024-07-13 ENCOUNTER — Ambulatory Visit: Attending: Cardiology | Admitting: Cardiology

## 2024-07-13 ENCOUNTER — Ambulatory Visit

## 2024-07-13 VITALS — BP 114/80 | HR 105 | Ht 71.0 in | Wt 237.0 lb

## 2024-07-13 DIAGNOSIS — I4891 Unspecified atrial fibrillation: Secondary | ICD-10-CM

## 2024-07-13 DIAGNOSIS — R000 Tachycardia, unspecified: Secondary | ICD-10-CM

## 2024-07-13 DIAGNOSIS — E782 Mixed hyperlipidemia: Secondary | ICD-10-CM | POA: Diagnosis not present

## 2024-07-13 DIAGNOSIS — R0609 Other forms of dyspnea: Secondary | ICD-10-CM

## 2024-07-13 DIAGNOSIS — I25110 Atherosclerotic heart disease of native coronary artery with unstable angina pectoris: Secondary | ICD-10-CM

## 2024-07-13 DIAGNOSIS — E1169 Type 2 diabetes mellitus with other specified complication: Secondary | ICD-10-CM

## 2024-07-13 NOTE — Patient Instructions (Signed)
 Medication Instructions:  Your physician recommends that you continue on your current medications as directed. Please refer to the Current Medication list given to you today.  *If you need a refill on your cardiac medications before your next appointment, please call your pharmacy*   Lab Work: None Ordered If you have labs (blood work) drawn today and your tests are completely normal, you will receive your results only by: MyChart Message (if you have MyChart) OR A paper copy in the mail If you have any lab test that is abnormal or we need to change your treatment, we will call you to review the results.   Testing/Procedures:  WHY IS MY DOCTOR PRESCRIBING ZIO? The Zio system is proven and trusted by physicians to detect and diagnose irregular heart rhythms -- and has been prescribed to hundreds of thousands of patients.  The FDA has cleared the Zio system to monitor for many different kinds of irregular heart rhythms. In a study, physicians were able to reach a diagnosis 90% of the time with the Zio system1.  You can wear the Zio monitor -- a small, discreet, comfortable patch -- during your normal day-to-day activity, including while you sleep, shower, and exercise, while it records every single heartbeat for analysis.  1Barrett, P., et al. Comparison of 24 Hour Holter Monitoring Versus 14 Day Novel Adhesive Patch Electrocardiographic Monitoring. American Journal of Medicine, 2014.  ZIO VS. HOLTER MONITORING The Zio monitor can be comfortably worn for up to 14 days. Holter monitors can be worn for 24 to 48 hours, limiting the time to record any irregular heart rhythms you may have. Zio is able to capture data for the 51% of patients who have their first symptom-triggered arrhythmia after 48 hours.1  LIVE WITHOUT RESTRICTIONS The Zio ambulatory cardiac monitor is a small, unobtrusive, and water-resistant patch--you might even forget you're wearing it. The Zio monitor records and stores  every beat of your heart, whether you're sleeping, working out, or showering.    Your physician has requested that you have an echocardiogram. Echocardiography is a painless test that uses sound waves to create images of your heart. It provides your doctor with information about the size and shape of your heart and how well your heart's chambers and valves are working. This procedure takes approximately one hour. There are no restrictions for this procedure. Please do NOT wear cologne, perfume, aftershave, or lotions (deodorant is allowed). Please arrive 15 minutes prior to your appointment time.  Please note: We ask at that you not bring children with you during ultrasound (echo/ vascular) testing. Due to room size and safety concerns, children are not allowed in the ultrasound rooms during exams. Our front office staff cannot provide observation of children in our lobby area while testing is being conducted. An adult accompanying a patient to their appointment will only be allowed in the ultrasound room at the discretion of the ultrasound technician under special circumstances. We apologize for any inconvenience.   Your physician has requested that you have a carotid duplex. This test is an ultrasound of the carotid arteries in your neck. It looks at blood flow through these arteries that supply the brain with blood. Allow one hour for this exam. There are no restrictions or special instructions.    Follow-Up: At [facility], you and your health needs are our priority.  As part of our continuing mission to provide you with exceptional heart care, we have created designated Provider Care Teams.  These Care Teams include  your primary Cardiologist (physician) and Advanced Practice Providers (APPs -  Physician Assistants and Nurse Practitioners) who all work together to provide you with the care you need, when you need it.  We recommend signing up for the patient portal called MyChart.  Sign up  information is provided on this After Visit Summary.  MyChart is used to connect with patients for Virtual Visits (Telemedicine).  Patients are able to view lab/test results, encounter notes, upcoming appointments, etc.  Non-urgent messages can be sent to your provider as well.   To learn more about what you can do with MyChart, go to [email or web address].    Your next appointment:   5 month(s)  The format for your next appointment:   In Person  Provider:   Lamar Fitch, MD    Other Instructions NA

## 2024-07-13 NOTE — Progress Notes (Signed)
 Cardiology Office Note:    Date:  07/13/2024   ID:  Lance Shepherd, DOB 24-May-1946, MRN [id number]  PCP:  Ina Marcellus RAMAN, MD  Cardiologist:  Lamar Fitch, MD    Referring MD: Ina Marcellus RAMAN, MD   Chief Complaint  Patient presents with   Follow-up  Doing fine  History of Present Illness:    Lance Shepherd is a 78 y.o. male complex past medical history which include coronary artery disease status post PTCA and stenting of the right coronary artery and circumflex done in 2016, later starting obtuse marginal branch done in April 2019, also diabetes, hypertension, COPD, TIA, close in December 2024 he ended up having 99% stenosis of large obtuse marginal branch that was PTCA and stented.  Did have moderate RCA stenosis and moderate proximal LAD stenosis.  Since have seen him last time he ended being in the hospital because of TIA twice in the span of 3 to 4 days he did have some visual disturbances as well as some memory issue.  MRI done in the hospital did not show that he had a stroke.  Workup today included echocardiogram which did not show any evidence of ASD, LDL was elevated but he did not take medication like he should,.  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  Since that time he says he is doing fine he complains being weak tired exhausted but otherwise seems to be doing well  Past Medical History:  Diagnosis Date   Acute neck pain 12/21/2019   Allergic rhinitis 11/16/2018   Behavior disturbance 04/10/2020   Benign prostatic hyperplasia without lower urinary tract symptoms 10/09/2015   Cervical discitis 01/01/2020   Chronic obstructive pulmonary disease (HCC) 10/09/2015   Chronic pain of left knee 06/16/2017   Constipation, chronic 04/10/2020   Coronary arteriosclerosis in native artery 08/07/2016   Coronary artery disease stent to RCA and circumflex in November 2017, stent to obtuse marginal branch in 2019 08/07/2016   Diabetes mellitus (HCC) 01/19/2020   Diabetes  mellitus type 2 in obese 09/13/2019   HTN (hypertension) 01/19/2020   Hyperlipidemia 10/09/2015   Hypertensive heart disease with heart failure (HCC) 10/09/2015   Long-term insulin  use (HCC) 08/12/2018   MCI (mild cognitive impairment) 09/13/2019   Medication monitoring encounter 03/01/2020   Mild anxiety 03/14/2016   NSTEMI (non-ST elevated myocardial infarction) (HCC) 09/01/2021   NSTEMI, initial episode of care [facility]) 12/25/2017   Formatting of this note might be different from the original. 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 C5-6   Primary osteoarthritis of right knee 06/23/2017   Sinus tachycardia by electrocardiogram 11/11/2019   Spondylosis of cervical region without myelopathy or radiculopathy 12/21/2019   Statin intolerance 09/13/2019   Statin myopathy 08/20/2020   Status post coronary artery stent placement 01/19/2018   Stroke (HCC) 04/07/2024    Past Surgical History:  Procedure Laterality Date   cardiacstent placement     COLONOSCOPY     CORONARY ANGIOGRAPHY N/A 09/03/2021   Procedure: CORONARY ANGIOGRAPHY;  Surgeon: Wonda Sharper, MD;  Location: [facility] INVASIVE CV LAB;  Service: Cardiovascular;  Laterality: N/A;   CORONARY STENT INTERVENTION N/A 09/03/2021   Procedure: CORONARY STENT INTERVENTION;  Surgeon: Wonda Sharper, MD;  Location: [facility] INVASIVE CV LAB;  Service: Cardiovascular;  Laterality: N/A;  hernia repaired     stent replacement      Current Medications: Current Meds  Medication Sig   aspirin  81 MG EC tablet Take 1 tablet (81 mg total) by mouth daily. Swallow whole.   Cholecalciferol (VITAMIN D3) 50 MCG (2000 UT) capsule Take 1 capsule (2,000 Units total) by mouth daily.   clopidogrel  (PLAVIX ) 75 MG tablet Take 1 tablet (75 mg total) by  mouth daily with breakfast.   fluticasone  (FLONASE ) 50 MCG/ACT nasal spray Place 1 spray into both nostrils daily.   furosemide  (LASIX ) 20 MG tablet Please take Lasix  40 mg = 2 pills for the next two days, then take Lasix  20 mg = 1 pill daily there after.   insulin  NPH-regular Human (70-30) 100 UNIT/ML injection Inject 100 Units into the skin 2 (two) times daily with a meal.   metoprolol  tartrate (LOPRESSOR ) 50 MG tablet Take 1 tablet (50 mg total) by mouth 2 (two) times daily.   nitroGLYCERIN  (NITROSTAT ) 0.4 MG SL tablet Place 1 tablet (0.4 mg total) under the tongue every 5 (five) minutes as needed for chest pain.   pravastatin  (PRAVACHOL ) 40 MG tablet Take 1 tablet (40 mg total) by mouth at bedtime.   sacubitril -valsartan  (ENTRESTO ) 24-26 MG Take 1 tablet by mouth 2 (two) times daily.   tamsulosin  (FLOMAX ) 0.4 MG CAPS capsule Take 0.4 mg by mouth daily.     Allergies:   Jardiance  [empagliflozin ], Ticagrelor, Metformin and related, Pioglitazone, and Statins   Social History   Socioeconomic History   Marital status: Married    Spouse name: Not on file   Number of children: Not on file   Years of education: Not on file   Highest education level: Not on file  Occupational History   Not on file  Tobacco Use   Smoking status: Never   Smokeless tobacco: Never  Substance and Sexual Activity   Alcohol use: Never   Drug use: Never   Sexual activity: Not Currently  Other Topics Concern   Not on file  Social History Narrative   Not on file   Social Drivers of Health   Financial Resource Strain: Not on file  Food Insecurity: Not on file  Transportation Needs: Not on file  Physical Activity: Not on file  Stress: Not on file  Social Connections: Not on file     Family History: The patient's family history includes COPD in his mother; Dementia in his mother; Lung cancer in his father. ROS:   Please see the history of present illness.    All 14 point review of systems negative  except as described per history of present illness  EKGs/Labs/Other Studies Reviewed:         Recent Labs: 03/16/2024: BUN 9; Creatinine, Ser 1.24; Potassium 4.3; Sodium 138  Recent Lipid Panel    Component Value Date/Time   CHOL 103 11/21/2021 1400   TRIG 138 11/21/2021 1400   HDL 39 (L) 11/21/2021 1400   CHOLHDL 2.6 11/21/2021 1400   CHOLHDL 5.7 09/05/2021 0145   VLDL 18 09/05/2021 0145   LDLCALC 40 11/21/2021 1400    Physical Exam:    VS:  BP 114/80   Pulse (!) 105   Ht 5' 11 (1.803 m)   Wt 237 lb (107.5 kg)   SpO2 99%   BMI 33.05 kg/m     Wt Readings from Last 3 Encounters:  07/13/24 237 lb (107.5 kg)  05/13/24 243 lb (110.2 kg)  03/30/24 250 lb (113.4 kg)  GEN:  Well nourished, well developed in no acute distress HEENT: Normal NECK: No JVD; No carotid bruits LYMPHATICS: No lymphadenopathy CARDIAC: RRR, no murmurs, no rubs, no gallops RESPIRATORY:  Clear to auscultation without rales, wheezing or rhonchi  ABDOMEN: Soft, non-tender, non-distended MUSCULOSKELETAL:  No edema; No deformity  SKIN: Warm and dry LOWER EXTREMITIES: no swelling NEUROLOGIC:  Alert and oriented x 3 PSYCHIATRIC:  Normal affect   ASSESSMENT:    1. Sinus tachycardia by electrocardiogram   2. Coronary artery disease involving native heart with unstable angina pectoris, unspecified vessel or lesion type (HCC)   3. Type 2 diabetes mellitus with other specified complication, without long-term current use of insulin  (HCC)   4. Mixed hyperlipidemia    PLAN:    In order of problems listed above:  Sinus tachycardia today we will do EKG to check.  To make sure he does not have an atrial fibrillation.  Ask him to wear Zio patch for 2 weeks again with the goal will be to rule out atrial fibrillation however the fact that his symptoms were sequential and just few days apart indicate that this is most likely vasal problem inside the right and embolic events.  But he did have occipital stroke  present oxygenation fine.  I will repeat carotic ultrasound because he did have some narrowing before.  Will do echocardiogram again to check left ventricular ejection fraction. Dyspnea on exertion echocardiogram will be done.  He said he slightly worsened that aspect. Dyslipidemia he is taking cholesterol medication which I will continue.  I do have his fasting lipid profile from May which show LDL of 40 LDL 35.  Continue present management. Peripheral vascular disease again we will schedule him to have carotic ultrasound.   Medication Adjustments/Labs and Tests Ordered: Current medicines are reviewed at length with the patient today.  Concerns regarding medicines are outlined above.  Orders Placed This Encounter  Procedures   EKG 12-Lead   Medication changes: No orders of the defined types were placed in this encounter.   Signed, Lamar DOROTHA Fitch, MD, [facility] 07/13/2024 2:15 PM    [facility]

## 2024-07-13 NOTE — Addendum Note (Signed)
 Addended by: ARLOA PLANAS D on: 07/13/2024 02:51 PM   Modules accepted: Orders

## 2024-07-20 NOTE — Progress Notes (Signed)
 Lance Shepherd                                          MRN: [id number]   07/20/2024   The [facility] Quality Team Specialist reviewed this patient medical record for the purposes of chart review for care gap closure. The following were reviewed: chart review for care gap closure-kidney health evaluation for diabetes:eGFR  and uACR.    [facility] Quality Team

## 2024-08-11 ENCOUNTER — Ambulatory Visit

## 2024-08-11 ENCOUNTER — Ambulatory Visit: Attending: Cardiology

## 2024-08-22 ENCOUNTER — Ambulatory Visit

## 2024-08-22 DIAGNOSIS — I25110 Atherosclerotic heart disease of native coronary artery with unstable angina pectoris: Secondary | ICD-10-CM

## 2024-08-29 ENCOUNTER — Ambulatory Visit: Payer: Self-pay | Admitting: Cardiology

## 2024-08-31 ENCOUNTER — Telehealth: Payer: Self-pay

## 2024-08-31 NOTE — Telephone Encounter (Signed)
LVM per DPR- per Dr. Vanetta Shawl note regarding Carotid US results. Encouraged to call with any questions. Routed to PCP.

## 2024-09-27 ENCOUNTER — Other Ambulatory Visit: Payer: Self-pay
